# Patient Record
Sex: Female | Born: 1950 | Race: White | Hispanic: No | Marital: Married | Smoking: Current every day smoker
Health system: Southern US, Community
[De-identification: ages and names within clinical notes are randomized; demographics above are authoritative.]

## PROBLEM LIST (undated history)

## (undated) DIAGNOSIS — M8580 Other specified disorders of bone density and structure, unspecified site: Secondary | ICD-10-CM

## (undated) DIAGNOSIS — F1090 Alcohol use, unspecified, uncomplicated: Secondary | ICD-10-CM

## (undated) DIAGNOSIS — C4490 Unspecified malignant neoplasm of skin, unspecified: Secondary | ICD-10-CM

## (undated) DIAGNOSIS — U071 COVID-19: Secondary | ICD-10-CM

## (undated) DIAGNOSIS — H269 Unspecified cataract: Secondary | ICD-10-CM

## (undated) DIAGNOSIS — M1990 Unspecified osteoarthritis, unspecified site: Secondary | ICD-10-CM

## (undated) DIAGNOSIS — C50919 Malignant neoplasm of unspecified site of unspecified female breast: Secondary | ICD-10-CM

## (undated) DIAGNOSIS — F39 Unspecified mood [affective] disorder: Secondary | ICD-10-CM

## (undated) DIAGNOSIS — F32A Depression, unspecified: Secondary | ICD-10-CM

## (undated) DIAGNOSIS — F419 Anxiety disorder, unspecified: Secondary | ICD-10-CM

## (undated) DIAGNOSIS — F329 Major depressive disorder, single episode, unspecified: Secondary | ICD-10-CM

## (undated) DIAGNOSIS — I10 Essential (primary) hypertension: Secondary | ICD-10-CM

## (undated) DIAGNOSIS — J449 Chronic obstructive pulmonary disease, unspecified: Secondary | ICD-10-CM

## (undated) DIAGNOSIS — Z789 Other specified health status: Secondary | ICD-10-CM

## (undated) DIAGNOSIS — J439 Emphysema, unspecified: Secondary | ICD-10-CM

## (undated) DIAGNOSIS — D126 Benign neoplasm of colon, unspecified: Secondary | ICD-10-CM

## (undated) DIAGNOSIS — T7840XA Allergy, unspecified, initial encounter: Secondary | ICD-10-CM

## (undated) DIAGNOSIS — F17200 Nicotine dependence, unspecified, uncomplicated: Secondary | ICD-10-CM

## (undated) DIAGNOSIS — C801 Malignant (primary) neoplasm, unspecified: Secondary | ICD-10-CM

## (undated) DIAGNOSIS — D696 Thrombocytopenia, unspecified: Secondary | ICD-10-CM

## (undated) HISTORY — DX: Malignant (primary) neoplasm, unspecified: C80.1

## (undated) HISTORY — DX: Other specified disorders of bone density and structure, unspecified site: M85.80

## (undated) HISTORY — DX: Unspecified mood (affective) disorder: F39

## (undated) HISTORY — PX: INDUCED ABORTION: SHX677

## (undated) HISTORY — DX: Essential (primary) hypertension: I10

## (undated) HISTORY — DX: Unspecified osteoarthritis, unspecified site: M19.90

## (undated) HISTORY — PX: TONSILLECTOMY: SUR1361

## (undated) HISTORY — DX: Other specified health status: Z78.9

## (undated) HISTORY — PX: FRACTURE SURGERY: SHX138

## (undated) HISTORY — DX: Unspecified cataract: H26.9

## (undated) HISTORY — DX: Nicotine dependence, unspecified, uncomplicated: F17.200

## (undated) HISTORY — DX: Anxiety disorder, unspecified: F41.9

## (undated) HISTORY — DX: Depression, unspecified: F32.A

## (undated) HISTORY — DX: Benign neoplasm of colon, unspecified: D12.6

## (undated) HISTORY — DX: Unspecified malignant neoplasm of skin, unspecified: C44.90

## (undated) HISTORY — DX: Major depressive disorder, single episode, unspecified: F32.9

## (undated) HISTORY — DX: Allergy, unspecified, initial encounter: T78.40XA

## (undated) HISTORY — DX: Emphysema, unspecified: J43.9

## (undated) HISTORY — DX: Malignant neoplasm of unspecified site of unspecified female breast: C50.919

## (undated) HISTORY — DX: Alcohol use, unspecified, uncomplicated: F10.90

---

## 1982-11-27 HISTORY — PX: FINGER FRACTURE SURGERY: SHX638

## 2005-09-04 ENCOUNTER — Ambulatory Visit: Payer: Self-pay | Admitting: Unknown Physician Specialty

## 2006-09-06 ENCOUNTER — Ambulatory Visit: Payer: Self-pay | Admitting: Unknown Physician Specialty

## 2007-02-18 ENCOUNTER — Ambulatory Visit: Payer: Self-pay | Admitting: Unknown Physician Specialty

## 2007-10-23 ENCOUNTER — Ambulatory Visit: Payer: Self-pay | Admitting: Unknown Physician Specialty

## 2007-11-26 ENCOUNTER — Ambulatory Visit: Payer: Self-pay | Admitting: Unknown Physician Specialty

## 2008-12-18 ENCOUNTER — Ambulatory Visit: Payer: Self-pay | Admitting: Unknown Physician Specialty

## 2010-01-28 ENCOUNTER — Ambulatory Visit: Payer: Self-pay | Admitting: Unknown Physician Specialty

## 2011-06-21 ENCOUNTER — Ambulatory Visit: Payer: Self-pay | Admitting: Unknown Physician Specialty

## 2012-06-04 ENCOUNTER — Ambulatory Visit: Payer: Self-pay

## 2012-06-24 ENCOUNTER — Ambulatory Visit: Payer: Self-pay

## 2012-06-25 ENCOUNTER — Ambulatory Visit: Payer: Self-pay

## 2012-07-15 ENCOUNTER — Ambulatory Visit: Payer: Self-pay | Admitting: Surgery

## 2012-09-23 ENCOUNTER — Ambulatory Visit: Payer: Self-pay | Admitting: Unknown Physician Specialty

## 2012-09-23 HISTORY — PX: COLONOSCOPY: SHX174

## 2012-11-27 HISTORY — PX: BREAST BIOPSY: SHX20

## 2016-06-28 ENCOUNTER — Inpatient Hospital Stay
Admission: RE | Admit: 2016-06-28 | Discharge: 2016-06-28 | Disposition: A | Discharge status: Home / Self Care | Payer: Self-pay | Source: Ambulatory Visit | Attending: *Deleted | Admitting: *Deleted

## 2016-06-28 ENCOUNTER — Other Ambulatory Visit: Payer: Self-pay | Admitting: *Deleted

## 2016-06-28 DIAGNOSIS — Z9289 Personal history of other medical treatment: Secondary | ICD-10-CM

## 2016-08-21 ENCOUNTER — Other Ambulatory Visit: Payer: Self-pay | Admitting: Certified Nurse Midwife

## 2016-08-21 DIAGNOSIS — Z1231 Encounter for screening mammogram for malignant neoplasm of breast: Secondary | ICD-10-CM

## 2016-09-15 ENCOUNTER — Encounter: Payer: Self-pay | Admitting: Radiology

## 2016-09-15 ENCOUNTER — Ambulatory Visit
Admission: RE | Admit: 2016-09-15 | Discharge: 2016-09-15 | Disposition: A | Discharge status: Home / Self Care | Payer: Medicare Other | Source: Ambulatory Visit | Attending: Certified Nurse Midwife | Admitting: Certified Nurse Midwife

## 2016-09-15 DIAGNOSIS — Z1231 Encounter for screening mammogram for malignant neoplasm of breast: Secondary | ICD-10-CM | POA: Insufficient documentation

## 2017-07-31 ENCOUNTER — Other Ambulatory Visit: Payer: Self-pay | Admitting: Certified Nurse Midwife

## 2017-08-15 ENCOUNTER — Other Ambulatory Visit: Payer: Self-pay | Admitting: Certified Nurse Midwife

## 2017-08-15 DIAGNOSIS — Z1231 Encounter for screening mammogram for malignant neoplasm of breast: Secondary | ICD-10-CM

## 2017-08-28 ENCOUNTER — Encounter: Payer: Self-pay | Admitting: Certified Nurse Midwife

## 2017-08-28 ENCOUNTER — Ambulatory Visit (INDEPENDENT_AMBULATORY_CARE_PROVIDER_SITE_OTHER): Payer: Medicare Other | Admitting: Certified Nurse Midwife

## 2017-08-28 VITALS — BP 110/70 | HR 76 | Ht 65.0 in | Wt 121.0 lb

## 2017-08-28 DIAGNOSIS — Z1239 Encounter for other screening for malignant neoplasm of breast: Secondary | ICD-10-CM

## 2017-08-28 DIAGNOSIS — Z01419 Encounter for gynecological examination (general) (routine) without abnormal findings: Secondary | ICD-10-CM

## 2017-08-28 DIAGNOSIS — Z1231 Encounter for screening mammogram for malignant neoplasm of breast: Secondary | ICD-10-CM | POA: Diagnosis not present

## 2017-08-28 DIAGNOSIS — Z789 Other specified health status: Secondary | ICD-10-CM | POA: Diagnosis not present

## 2017-08-28 DIAGNOSIS — Z13820 Encounter for screening for osteoporosis: Secondary | ICD-10-CM

## 2017-08-28 DIAGNOSIS — F1090 Alcohol use, unspecified, uncomplicated: Secondary | ICD-10-CM
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
## 2017-09-01 ENCOUNTER — Encounter: Payer: Self-pay | Admitting: Certified Nurse Midwife

## 2017-09-01 DIAGNOSIS — M8580 Other specified disorders of bone density and structure, unspecified site: Secondary | ICD-10-CM | POA: Insufficient documentation

## 2017-09-01 DIAGNOSIS — Z789 Other specified health status: Secondary | ICD-10-CM | POA: Insufficient documentation

## 2017-09-01 DIAGNOSIS — F329 Major depressive disorder, single episode, unspecified: Secondary | ICD-10-CM | POA: Insufficient documentation

## 2017-09-03 ENCOUNTER — Telehealth: Payer: Self-pay | Admitting: Certified Nurse Midwife

## 2017-09-03 NOTE — Telephone Encounter (Signed)
-----   Message from Dalia Heading, [place] sent at 09/02/2017  2:55 PM EDT ----- Regarding: schedule screening Dexa at [facility] schedule screening dexa. She is scheduled for a mammogram 10/18. Can we do it the same day?

## 2017-09-03 NOTE — Telephone Encounter (Signed)
Lmtrc

## 2017-09-04 NOTE — Telephone Encounter (Signed)
Patient is aware of appointment °

## 2017-09-19 ENCOUNTER — Ambulatory Visit
Admission: RE | Admit: 2017-09-19 | Discharge: 2017-09-19 | Disposition: A | Discharge status: Home / Self Care | Payer: Medicare Other | Source: Ambulatory Visit | Attending: Certified Nurse Midwife | Admitting: Certified Nurse Midwife

## 2017-09-19 DIAGNOSIS — Z1231 Encounter for screening mammogram for malignant neoplasm of breast: Secondary | ICD-10-CM | POA: Diagnosis not present

## 2017-10-03 ENCOUNTER — Ambulatory Visit
Admission: RE | Admit: 2017-10-03 | Discharge: 2017-10-03 | Disposition: A | Discharge status: Home / Self Care | Payer: Medicare Other | Source: Ambulatory Visit | Attending: Certified Nurse Midwife | Admitting: Certified Nurse Midwife

## 2017-10-03 DIAGNOSIS — Z13820 Encounter for screening for osteoporosis: Secondary | ICD-10-CM

## 2017-10-03 DIAGNOSIS — M8588 Other specified disorders of bone density and structure, other site: Secondary | ICD-10-CM | POA: Diagnosis not present

## 2017-10-28 ENCOUNTER — Other Ambulatory Visit: Payer: Self-pay | Admitting: Certified Nurse Midwife

## 2017-10-29 ENCOUNTER — Encounter: Payer: Self-pay | Admitting: Certified Nurse Midwife

## 2017-10-29 ENCOUNTER — Telehealth: Payer: Self-pay | Admitting: Certified Nurse Midwife

## 2017-10-29 NOTE — Telephone Encounter (Signed)
10/29/2017 Spoke with patient regarding recent DEXA scan. There is osteopenia of both the spine (T score-2.0) and femur (T score -2.2, was -1.4previously). Frax scores with drinking less than 3 units/day was 12/3.9% and with 3 or greater alcohol drinks is 15/5.9%. Advised that treatment is recommended. Discussed treatments like biphosphonates (Actonel, Fosamax, Boniva), how they are taken and possible side effects. Advised that there are other treatments, but would refer to endocrinologist if interested in other injectable medications.  Discussed risk factors of smoking and daily drinking alcohol and encouraged to decrease both of these. Patient does get adequate calcium in her diet: milk 1-2 cups daily, cheese, OJ with calcium. Used to take dog for walks, but not walking anymore. Not exercising.  Patient decided she did not want any other treatment. Will continue to get the calcium in her diet. Encouraged her to take a vitamin D3 supplement, exercise, and decrease smoking and drinking. Given website for NOF.org for ideas on other exercises.   FU next year for annual.

## 2017-11-27 DIAGNOSIS — D126 Benign neoplasm of colon, unspecified: Secondary | ICD-10-CM

## 2017-11-27 HISTORY — DX: Benign neoplasm of colon, unspecified: D12.6

## 2018-04-23 DIAGNOSIS — F1090 Alcohol use, unspecified, uncomplicated: Secondary | ICD-10-CM | POA: Insufficient documentation

## 2018-04-23 DIAGNOSIS — F32A Depression, unspecified: Secondary | ICD-10-CM | POA: Insufficient documentation

## 2018-04-23 DIAGNOSIS — Z789 Other specified health status: Secondary | ICD-10-CM | POA: Insufficient documentation

## 2018-08-09 ENCOUNTER — Encounter: Admission: RE | Payer: Self-pay | Source: Ambulatory Visit

## 2018-08-09 ENCOUNTER — Encounter
Admission: RE | Disposition: A | Discharge status: Home / Self Care | Payer: Self-pay | Source: Ambulatory Visit | Attending: Unknown Physician Specialty

## 2018-08-09 ENCOUNTER — Ambulatory Visit: Payer: Medicare Other | Admitting: Anesthesiology

## 2018-08-09 ENCOUNTER — Encounter: Payer: Self-pay | Admitting: *Deleted

## 2018-08-09 ENCOUNTER — Ambulatory Visit
Admission: RE | Admit: 2018-08-09 | Discharge: 2018-08-09 | Disposition: A | Discharge status: Home / Self Care | Payer: Medicare Other | Source: Ambulatory Visit | Attending: Unknown Physician Specialty | Admitting: Unknown Physician Specialty

## 2018-08-09 ENCOUNTER — Ambulatory Visit
Admission: RE | Admit: 2018-08-09 | Payer: Medicare Other | Source: Ambulatory Visit | Admitting: Unknown Physician Specialty

## 2018-08-09 ENCOUNTER — Other Ambulatory Visit: Payer: Self-pay

## 2018-08-09 DIAGNOSIS — Z1211 Encounter for screening for malignant neoplasm of colon: Secondary | ICD-10-CM | POA: Insufficient documentation

## 2018-08-09 DIAGNOSIS — F17200 Nicotine dependence, unspecified, uncomplicated: Secondary | ICD-10-CM | POA: Insufficient documentation

## 2018-08-09 DIAGNOSIS — Z8249 Family history of ischemic heart disease and other diseases of the circulatory system: Secondary | ICD-10-CM | POA: Insufficient documentation

## 2018-08-09 DIAGNOSIS — F329 Major depressive disorder, single episode, unspecified: Secondary | ICD-10-CM | POA: Insufficient documentation

## 2018-08-09 DIAGNOSIS — Z880 Allergy status to penicillin: Secondary | ICD-10-CM | POA: Diagnosis not present

## 2018-08-09 DIAGNOSIS — Z885 Allergy status to narcotic agent status: Secondary | ICD-10-CM | POA: Insufficient documentation

## 2018-08-09 DIAGNOSIS — Z801 Family history of malignant neoplasm of trachea, bronchus and lung: Secondary | ICD-10-CM | POA: Insufficient documentation

## 2018-08-09 DIAGNOSIS — M8580 Other specified disorders of bone density and structure, unspecified site: Secondary | ICD-10-CM | POA: Diagnosis not present

## 2018-08-09 DIAGNOSIS — Z800 Family history of malignant neoplasm of digestive organs: Secondary | ICD-10-CM | POA: Diagnosis not present

## 2018-08-09 DIAGNOSIS — Z79899 Other long term (current) drug therapy: Secondary | ICD-10-CM | POA: Diagnosis not present

## 2018-08-09 DIAGNOSIS — Z8261 Family history of arthritis: Secondary | ICD-10-CM | POA: Diagnosis not present

## 2018-08-09 DIAGNOSIS — Z86010 Personal history of colonic polyps: Secondary | ICD-10-CM | POA: Diagnosis not present

## 2018-08-09 DIAGNOSIS — D123 Benign neoplasm of transverse colon: Secondary | ICD-10-CM | POA: Insufficient documentation

## 2018-08-09 DIAGNOSIS — D125 Benign neoplasm of sigmoid colon: Secondary | ICD-10-CM | POA: Insufficient documentation

## 2018-08-09 DIAGNOSIS — K640 First degree hemorrhoids: Secondary | ICD-10-CM | POA: Insufficient documentation

## 2018-08-09 HISTORY — PX: COLONOSCOPY WITH PROPOFOL: SHX5780

## 2018-08-09 SURGERY — COLONOSCOPY WITH PROPOFOL
Anesthesia: General

## 2018-08-09 MED ORDER — LIDOCAINE HCL (CARDIAC) PF 100 MG/5ML IV SOSY
PREFILLED_SYRINGE | INTRAVENOUS | Status: DC | PRN
  Administered 2018-08-09: 25 mg via INTRATRACHEAL

## 2018-08-09 MED ORDER — SODIUM CHLORIDE 0.9 % IV SOLN: INTRAVENOUS | Status: DC

## 2018-08-09 MED ORDER — SODIUM CHLORIDE 0.9 % IV SOLN
INTRAVENOUS | Status: DC
  Administered 2018-08-09: 10:00:00 via INTRAVENOUS

## 2018-08-09 MED ORDER — PROPOFOL 500 MG/50ML IV EMUL
INTRAVENOUS | Status: DC | PRN
  Administered 2018-08-09: 80 ug/kg/min via INTRAVENOUS

## 2018-08-09 MED ORDER — PROPOFOL 10 MG/ML IV BOLUS
INTRAVENOUS | Status: DC | PRN
  Administered 2018-08-09: 100 mg via INTRAVENOUS

## 2018-08-09 NOTE — Anesthesia Preprocedure Evaluation (Signed)
Anesthesia Evaluation  Patient identified by MRN, date of birth, ID band Patient awake    Reviewed: Allergy & Precautions, NPO status , Patient's Chart, lab work & pertinent test results  History of Anesthesia Complications Negative for: history of anesthetic complications  Airway Mallampati: II       Dental  (+) Partial Lower, Partial Upper   Pulmonary neg sleep apnea, neg COPD, Current Smoker,           Cardiovascular (-) hypertension(-) Past MI and (-) CHF (-) dysrhythmias (-) Valvular Problems/Murmurs     Neuro/Psych neg Seizures Depression    GI/Hepatic Neg liver ROS, neg GERD  ,  Endo/Other  neg diabetes  Renal/GU negative Renal ROS     Musculoskeletal   Abdominal   Peds  Hematology   Anesthesia Other Findings   Reproductive/Obstetrics                             Anesthesia Physical Anesthesia Plan  ASA: II  Anesthesia Plan: General   Post-op Pain Management:    Induction: Intravenous  PONV Risk Score and Plan: 2 and Propofol infusion and TIVA  Airway Management Planned: Nasal Cannula  Additional Equipment:   Intra-op Plan:   Post-operative Plan:   Informed Consent: I have reviewed the patients History and Physical, chart, labs and discussed the procedure including the risks, benefits and alternatives for the proposed anesthesia with the patient or authorized representative who has indicated his/her understanding and acceptance.     Plan Discussed with:   Anesthesia Plan Comments:         Anesthesia Quick Evaluation

## 2018-08-09 NOTE — Anesthesia Post-op Follow-up Note (Signed)
Anesthesia QCDR form completed.        

## 2018-08-09 NOTE — Anesthesia Postprocedure Evaluation (Signed)
Anesthesia Post Note  Patient: Sandra Franco  Procedure(s) Performed: COLONOSCOPY WITH PROPOFOL (N/A )  Patient location during evaluation: Endoscopy Anesthesia Type: General Level of consciousness: awake and alert Pain management: pain level controlled Vital Signs Assessment: post-procedure vital signs reviewed and stable Respiratory status: spontaneous breathing and respiratory function stable Cardiovascular status: stable Anesthetic complications: no     Last Vitals:  Vitals:   08/09/18 1038 08/09/18 1048  BP: 117/85 132/83  Pulse: 76 70  Resp: 20 14  Temp:    SpO2: 100% 100%    Last Pain:  Vitals:   08/09/18 1048  TempSrc:   PainSc: 0-No pain                 KEPHART,WILLIAM K

## 2018-08-09 NOTE — Transfer of Care (Signed)
Immediate Anesthesia Transfer of Care Note  Patient: Sandra Franco  Procedure(s) Performed: COLONOSCOPY WITH PROPOFOL (N/A )  Patient Location: PACU and Endoscopy Unit  Anesthesia Type:General  Level of Consciousness: awake  Airway & Oxygen Therapy: Patient Spontanous Breathing and Patient connected to nasal cannula oxygen  Post-op Assessment: Report given to RN and Post -op Vital signs reviewed and stable  Post vital signs: Reviewed and stable  Last Vitals:  Vitals Value Taken Time  BP    Temp    Pulse 80 08/09/2018 10:21 AM  Resp 18 08/09/2018 10:21 AM  SpO2 100 % 08/09/2018 10:21 AM  Vitals shown include unvalidated device data.  Last Pain:  Vitals:   08/09/18 0916  TempSrc: Tympanic  PainSc: 0-No pain         Complications: No apparent anesthesia complications

## 2018-08-09 NOTE — H&P (Signed)
Primary Care Physician:  Dalia Heading, CNM Primary Gastroenterologist:  Dr. Vira Agar  Pre-Procedure History & Physical: HPI:  Sandra Franco is a 67 y.o. female is here for an colonoscopy.  Done for [facility] of colon polyps.   Past Medical History:  Diagnosis Date  . Depression   . Heavy alcohol consumption   . Osteopenia     Past Surgical History:  Procedure Laterality Date  . BREAST BIOPSY Right 2014   NEG  . COLONOSCOPY  09/23/2012   negative  . FINGER FRACTURE SURGERY Right 1984  . TONSILLECTOMY      Prior to Admission medications   Medication Sig Start Date End Date Taking? Authorizing Provider  buPROPion (WELLBUTRIN XL) 300 MG 24 hr tablet take 1 tablet by mouth once daily 10/29/17  Yes Dalia Heading, CNM  Calcium Citrate-Vitamin D (CALCIUM + D PO) Take 1 capsule by mouth daily.   Yes [provider]  Multiple Vitamin (MULTIVITAMIN) capsule Take 1 capsule by mouth daily.   Yes [provider]  FLUZONE HIGH-DOSE 0.5 ML injection inject 0.5 milliliter intramuscularly 08/24/17   [provider]    Allergies as of 08/08/2018 - Review Complete 08/08/2018  Allergen Reaction Noted  . Amoxicillin Rash 08/28/2017  . Codeine Nausea Only 08/28/2017    Family History  Problem Relation Age of Onset  . Rheum arthritis Mother   . Hypertension Mother   . Lung cancer Father 47  . Pancreatic cancer Maternal Aunt 75  . Breast cancer Neg Hx     Social History   Socioeconomic History  . Marital status: Married    Spouse name: Novella Rob  . Number of children: 1  . Years of education: Not on file  . Highest education level: Not on file  Occupational History  . Occupation: retired Tour manager  . Financial resource strain: Not on file  . Food insecurity:    Worry: Not on file    Inability: Not on file  . Transportation needs:    Medical: Not on file    Non-medical: Not on file  Tobacco Use  . Smoking status: Current Every Day Smoker     Packs/day: 0.50    Types: Cigarettes  . Smokeless tobacco: Never Used  Substance and Sexual Activity  . Alcohol use: Yes    Alcohol/week: 14.0 standard drinks    Types: 14 Cans of beer per week    Comment: 1-4 cans/day  . Drug use: No  . Sexual activity: Not Currently    Birth control/protection: Post-menopausal  Lifestyle  . Physical activity:    Days per week: Not on file    Minutes per session: Not on file  . Stress: Not on file  Relationships  . Social connections:    Talks on phone: Not on file    Gets together: Not on file    Attends religious service: Not on file    Active member of club or organization: Not on file    Attends meetings of clubs or organizations: Not on file    Relationship status: Not on file  . Intimate partner violence:    Fear of current or ex partner: Not on file    Emotionally abused: Not on file    Physically abused: Not on file    Forced sexual activity: Not on file  Other Topics Concern  . Not on file  Social History Narrative  . Not on file    Review of Systems: See HPI, otherwise negative  ROS  Physical Exam: BP (!) 122/95   Pulse 82   Temp (!) 97.5 F (36.4 C) (Tympanic)   Resp 16   Ht 5\' 5"  (1.651 m)   Wt 56.7 kg   SpO2 99%   BMI 20.80 kg/m  General:   Alert,  pleasant and cooperative in NAD Head:  Normocephalic and atraumatic. Neck:  Supple; no masses or thyromegaly. Lungs:  Clear throughout to auscultation.    Heart:  Regular rate and rhythm. Abdomen:  Soft, nontender and nondistended. Normal bowel sounds, without guarding, and without rebound.   Neurologic:  Alert and  oriented x4;  grossly normal neurologically.  Impression/Plan: Sandra Franco is here for an colonoscopy to be performed for [facility] colon polyps.  Risks, benefits, limitations, and alternatives regarding  colonoscopy have been reviewed with the patient.  Questions have been answered.  All parties agreeable.   Gaylyn Cheers, MD  08/09/2018, 9:48 AM

## 2018-08-09 NOTE — Op Note (Addendum)
[facility] Patient Name: Sandra Franco Procedure Date: 08/09/2018 9:42 AM MRN: [id number] Account #: [id number] Date of Birth: Jan 29, 1951 Admit Type: Outpatient Age: 67 Room: [facility] ENDO ROOM 3 Gender: Female Note Status: Finalized Procedure:            Colonoscopy Indications:          High risk colon cancer surveillance: Personal history                        of colonic polyps Providers:            Manya Silvas, MD Referring MD:         Jesus Genera. Danise Mina (Referring MD) Medicines:            Propofol per Anesthesia Complications:        No immediate complications. Procedure:            Pre-Anesthesia Assessment:                       - After reviewing the risks and benefits, the patient                        was deemed in satisfactory condition to undergo the                        procedure.                       After obtaining informed consent, the colonoscope was                        passed under direct vision. Throughout the procedure,                        the patient's blood pressure, pulse, and oxygen                        saturations were monitored continuously. The                        Colonoscope was introduced through the anus and                        advanced to the the cecum, identified by appendiceal                        orifice and ileocecal valve. The colonoscopy was                        performed without difficulty. The patient tolerated the                        procedure well. The quality of the bowel preparation                        was excellent. Findings:      A small polyp was found in the transverse colon. The polyp was sessile.       The polyp was removed with a hot snare. Resection and retrieval were       complete.      A diminutive polyp was found in  the transverse colon. The polyp was       sessile. The polyp was removed with a hot snare. Resection and retrieval       were complete.      A  diminutive polyp was found in the sigmoid colon. The polyp was       sessile. The polyp was removed with a hot snare. Resection and retrieval       were complete.      Internal hemorrhoids were found during endoscopy. The hemorrhoids were       small and Grade I (internal hemorrhoids that do not prolapse).      The exam was otherwise without abnormality. Impression:           - One small polyp in the transverse colon, removed with                        a hot snare. Resected and retrieved.                       - One diminutive polyp in the transverse colon, removed                        with a hot snare. Resected and retrieved.                       - One diminutive polyp in the sigmoid colon, removed                        with a hot snare. Resected and retrieved.                       - Internal hemorrhoids.                       - The examination was otherwise normal. Recommendation:       - Await pathology results. Manya Silvas, MD 08/09/2018 10:18:23 AM This report has been signed electronically. Number of Addenda: 0 Note Initiated On: 08/09/2018 9:42 AM Scope Withdrawal Time: 0 hours 10 minutes 48 seconds  Total Procedure Duration: 0 hours 20 minutes 49 seconds       [facility]

## 2018-08-12 ENCOUNTER — Encounter: Payer: Self-pay | Admitting: Unknown Physician Specialty

## 2018-08-12 LAB — SURGICAL PATHOLOGY

## 2018-09-06 ENCOUNTER — Other Ambulatory Visit: Payer: Self-pay | Admitting: Certified Nurse Midwife

## 2018-09-06 DIAGNOSIS — Z1231 Encounter for screening mammogram for malignant neoplasm of breast: Secondary | ICD-10-CM

## 2018-09-26 ENCOUNTER — Ambulatory Visit
Admission: RE | Admit: 2018-09-26 | Discharge: 2018-09-26 | Disposition: A | Discharge status: Home / Self Care | Payer: Medicare Other | Source: Ambulatory Visit | Attending: Certified Nurse Midwife | Admitting: Certified Nurse Midwife

## 2018-09-26 DIAGNOSIS — Z1231 Encounter for screening mammogram for malignant neoplasm of breast: Secondary | ICD-10-CM | POA: Diagnosis not present

## 2018-10-02 ENCOUNTER — Encounter: Payer: Self-pay | Admitting: Certified Nurse Midwife

## 2018-10-02 ENCOUNTER — Ambulatory Visit (INDEPENDENT_AMBULATORY_CARE_PROVIDER_SITE_OTHER): Payer: Medicare Other | Admitting: Certified Nurse Midwife

## 2018-10-02 VITALS — BP 120/70 | HR 84 | Ht 65.0 in | Wt 120.5 lb

## 2018-10-02 DIAGNOSIS — F17200 Nicotine dependence, unspecified, uncomplicated: Secondary | ICD-10-CM

## 2018-10-02 DIAGNOSIS — Z789 Other specified health status: Secondary | ICD-10-CM

## 2018-10-02 DIAGNOSIS — Z862 Personal history of diseases of the blood and blood-forming organs and certain disorders involving the immune mechanism: Secondary | ICD-10-CM

## 2018-10-02 DIAGNOSIS — Z01419 Encounter for gynecological examination (general) (routine) without abnormal findings: Secondary | ICD-10-CM | POA: Diagnosis not present

## 2018-10-02 DIAGNOSIS — Z13220 Encounter for screening for lipoid disorders: Secondary | ICD-10-CM

## 2018-10-02 DIAGNOSIS — F1090 Alcohol use, unspecified, uncomplicated: Secondary | ICD-10-CM

## 2018-10-02 DIAGNOSIS — M85859 Other specified disorders of bone density and structure, unspecified thigh: Secondary | ICD-10-CM

## 2018-10-02 MED ORDER — BUPROPION HCL ER (XL) 300 MG PO TB24: 300.0000 mg | ORAL_TABLET | Freq: Every day | ORAL | 3 refills | Status: DC
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
## 2018-10-03 LAB — COMPREHENSIVE METABOLIC PANEL
A/G RATIO: 2.3 — AB (ref 1.2–2.2)
ALK PHOS: 81 IU/L (ref 39–117)
ALT: 16 IU/L (ref 0–32)
AST: 22 IU/L (ref 0–40)
Albumin: 5 g/dL — ABNORMAL HIGH (ref 3.6–4.8)
BUN/Creatinine Ratio: 10 — ABNORMAL LOW (ref 12–28)
BUN: 8 mg/dL (ref 8–27)
Bilirubin Total: 0.5 mg/dL (ref 0.0–1.2)
CHLORIDE: 98 mmol/L (ref 96–106)
CO2: 26 mmol/L (ref 20–29)
Calcium: 10.8 mg/dL — ABNORMAL HIGH (ref 8.7–10.3)
Creatinine, Ser: 0.79 mg/dL (ref 0.57–1.00)
GFR calc Af Amer: 88 mL/min/{1.73_m2} (ref >59)
GFR calc non Af Amer: 77 mL/min/{1.73_m2} (ref >59)
GLOBULIN, TOTAL: 2.2 g/dL (ref 1.5–4.5)
Glucose: 74 mg/dL (ref 65–99)
POTASSIUM: 5.1 mmol/L (ref 3.5–5.2)
SODIUM: 141 mmol/L (ref 134–144)
Total Protein: 7.2 g/dL (ref 6.0–8.5)

## 2018-10-03 LAB — CBC WITH DIFFERENTIAL/PLATELET
BASOS: 1 %
Basophils Absolute: 0.1 10*3/uL (ref 0.0–0.2)
EOS (ABSOLUTE): 0.5 10*3/uL — AB (ref 0.0–0.4)
Eos: 7 %
HEMATOCRIT: 44.8 % (ref 34.0–46.6)
Hemoglobin: 15.1 g/dL (ref 11.1–15.9)
IMMATURE GRANULOCYTES: 0 %
Immature Grans (Abs): 0 10*3/uL (ref 0.0–0.1)
Lymphocytes Absolute: 2.3 10*3/uL (ref 0.7–3.1)
Lymphs: 38 %
MCH: 30.4 pg (ref 26.6–33.0)
MCHC: 33.7 g/dL (ref 31.5–35.7)
MCV: 90 fL (ref 79–97)
MONOS ABS: 0.5 10*3/uL (ref 0.1–0.9)
Monocytes: 7 %
NEUTROS PCT: 47 %
Neutrophils Absolute: 3.2 10*3/uL (ref 1.4–7.0)
Platelets: 114 10*3/uL — ABNORMAL LOW (ref 150–450)
RBC: 4.97 x10E6/uL (ref 3.77–5.28)
RDW: 13 % (ref 12.3–15.4)
WBC: 6.5 10*3/uL (ref 3.4–10.8)

## 2018-10-03 LAB — LIPID PANEL
CHOL/HDL RATIO: 2.8 ratio (ref 0.0–4.4)
CHOLESTEROL TOTAL: 220 mg/dL — AB (ref 100–199)
HDL: 80 mg/dL (ref >39)
LDL CALC: 117 mg/dL — AB (ref 0–99)
Triglycerides: 117 mg/dL (ref 0–149)
VLDL Cholesterol Cal: 23 mg/dL (ref 5–40)

## 2018-10-04 ENCOUNTER — Encounter: Payer: Self-pay | Admitting: Certified Nurse Midwife

## 2018-10-04 DIAGNOSIS — F17200 Nicotine dependence, unspecified, uncomplicated: Secondary | ICD-10-CM | POA: Insufficient documentation

## 2019-01-17 ENCOUNTER — Other Ambulatory Visit: Payer: Self-pay | Admitting: Certified Nurse Midwife

## 2019-01-17 ENCOUNTER — Encounter: Payer: Self-pay | Admitting: Certified Nurse Midwife

## 2019-01-20 ENCOUNTER — Encounter: Payer: Self-pay | Admitting: Certified Nurse Midwife

## 2019-01-20 DIAGNOSIS — D696 Thrombocytopenia, unspecified: Secondary | ICD-10-CM | POA: Insufficient documentation

## 2019-01-25 ENCOUNTER — Other Ambulatory Visit
Admission: RE | Admit: 2019-01-25 | Discharge: 2019-01-25 | Disposition: A | Discharge status: Home / Self Care | Payer: Medicare Other | Source: Ambulatory Visit | Attending: Certified Nurse Midwife | Admitting: Certified Nurse Midwife

## 2019-01-25 DIAGNOSIS — D696 Thrombocytopenia, unspecified: Secondary | ICD-10-CM | POA: Insufficient documentation

## 2019-01-25 LAB — CBC
HEMATOCRIT: 42.3 % (ref 36.0–46.0)
HEMOGLOBIN: 14 g/dL (ref 12.0–15.0)
MCH: 30.2 pg (ref 26.0–34.0)
MCHC: 33.1 g/dL (ref 30.0–36.0)
MCV: 91.2 fL (ref 80.0–100.0)
NRBC: 0 % (ref 0.0–0.2)
Platelets: 240 10*3/uL (ref 150–400)
RBC: 4.64 MIL/uL (ref 3.87–5.11)
RDW: 13.2 % (ref 11.5–15.5)
WBC: 6 10*3/uL (ref 4.0–10.5)

## 2019-01-25 LAB — TECHNOLOGIST SMEAR REVIEW: TECH REVIEW: NORMAL

## 2019-10-08 ENCOUNTER — Other Ambulatory Visit: Payer: Self-pay | Admitting: Certified Nurse Midwife
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
## 2019-11-17 ENCOUNTER — Ambulatory Visit (INDEPENDENT_AMBULATORY_CARE_PROVIDER_SITE_OTHER): Payer: Medicare Other | Admitting: Certified Nurse Midwife

## 2019-11-17 ENCOUNTER — Other Ambulatory Visit: Payer: Self-pay

## 2019-11-17 ENCOUNTER — Other Ambulatory Visit (HOSPITAL_COMMUNITY)
Admission: RE | Admit: 2019-11-17 | Discharge: 2019-11-17 | Disposition: A | Discharge status: Home / Self Care | Payer: Medicare Other | Source: Ambulatory Visit | Attending: Certified Nurse Midwife | Admitting: Certified Nurse Midwife

## 2019-11-17 ENCOUNTER — Encounter: Payer: Self-pay | Admitting: Certified Nurse Midwife

## 2019-11-17 VITALS — BP 130/80 | HR 77 | Temp 97.3°F | Ht 65.0 in | Wt 116.0 lb

## 2019-11-17 DIAGNOSIS — F329 Major depressive disorder, single episode, unspecified: Secondary | ICD-10-CM

## 2019-11-17 DIAGNOSIS — Z124 Encounter for screening for malignant neoplasm of cervix: Secondary | ICD-10-CM | POA: Insufficient documentation

## 2019-11-17 DIAGNOSIS — F17200 Nicotine dependence, unspecified, uncomplicated: Secondary | ICD-10-CM

## 2019-11-17 DIAGNOSIS — M8589 Other specified disorders of bone density and structure, multiple sites: Secondary | ICD-10-CM

## 2019-11-17 DIAGNOSIS — Z01419 Encounter for gynecological examination (general) (routine) without abnormal findings: Secondary | ICD-10-CM | POA: Diagnosis not present

## 2019-11-17 DIAGNOSIS — F32A Depression, unspecified: Secondary | ICD-10-CM

## 2019-11-19 LAB — CYTOLOGY - PAP: Diagnosis: NEGATIVE

## 2020-01-05 ENCOUNTER — Other Ambulatory Visit: Payer: Self-pay | Admitting: Certified Nurse Midwife

## 2020-12-24 ENCOUNTER — Other Ambulatory Visit: Payer: Self-pay

## 2020-12-24 ENCOUNTER — Ambulatory Visit (INDEPENDENT_AMBULATORY_CARE_PROVIDER_SITE_OTHER): Payer: Medicare PPO | Admitting: Adult Health

## 2020-12-24 ENCOUNTER — Encounter: Payer: Self-pay | Admitting: Adult Health

## 2020-12-24 VITALS — BP 145/89 | HR 82 | Temp 97.8°F | Resp 16 | Ht 65.0 in | Wt 112.2 lb

## 2020-12-24 DIAGNOSIS — F17200 Nicotine dependence, unspecified, uncomplicated: Secondary | ICD-10-CM

## 2020-12-24 DIAGNOSIS — Z1389 Encounter for screening for other disorder: Secondary | ICD-10-CM

## 2020-12-24 DIAGNOSIS — Z1231 Encounter for screening mammogram for malignant neoplasm of breast: Secondary | ICD-10-CM | POA: Diagnosis not present

## 2020-12-24 DIAGNOSIS — Z122 Encounter for screening for malignant neoplasm of respiratory organs: Secondary | ICD-10-CM

## 2020-12-24 DIAGNOSIS — F32A Depression, unspecified: Secondary | ICD-10-CM

## 2020-12-24 LAB — POCT URINALYSIS DIPSTICK
Bilirubin, UA: NEGATIVE
Blood, UA: NEGATIVE
Glucose, UA: NEGATIVE
Ketones, UA: NEGATIVE
Leukocytes, UA: NEGATIVE
Nitrite, UA: NEGATIVE
Protein, UA: NEGATIVE
Spec Grav, UA: 1.01 (ref 1.010–1.025)
Urobilinogen, UA: 0.2 E.U./dL
pH, UA: 5 (ref 5.0–8.0)

## 2020-12-24 MED ORDER — BUPROPION HCL ER (XL) 300 MG PO TB24: 300.0000 mg | ORAL_TABLET | Freq: Every day | ORAL | 3 refills | Status: DC

## 2020-12-24 NOTE — Progress Notes (Signed)
New patient visit   Patient: Sandra Franco   DOB: 04/07/1951   70 y.o. Female  MRN: [id number] Visit Date: 12/24/2020  Today's healthcare provider: Jairo BenMichelle Smith Flinchum, FNP   Chief Complaint  Patient presents with  . New Patient (Initial Visit)   Subjective    Sandra SenegalLinda Wahlquist is a 70 y.o. female who presents today as a new patient to establish care.  HPI  Patient presents in office to establish care she states that she has no questions or concerns to address today. Patient reports that she follows a well balanced diet, she is not actively exercising at this time and states that sleep patterns are fairly well.    Smoker 45 years - smokes 1/2 ppd PAP was normal in 11/17/2019   Patient  denies any fever, body aches,chills, rash, chest pain, shortness of breath, nausea, vomiting, or diarrhea.  Denies dizziness, lightheadedness, pre syncopal or syncopal episodes.   Past Medical History:  Diagnosis Date  . Depression   . Heavy alcohol consumption   . Mood disorder (HCC)   . Osteopenia   . Tobacco use disorder   . Tubular adenoma of colon 2019   needs another colonoscopy in 3 years   Past Surgical History:  Procedure Laterality Date  . BREAST BIOPSY Right 2014   NEG  . COLONOSCOPY  09/23/2012   negative  . COLONOSCOPY WITH PROPOFOL N/A 08/09/2018   Procedure: COLONOSCOPY WITH PROPOFOL;  Surgeon: Scot JunElliott, Robert T, MD;  [facility] ENDOSCOPY;  Service: Endoscopy;  Laterality: N/A;  . FINGER FRACTURE SURGERY Right 1984  . INDUCED ABORTION    . TONSILLECTOMY     Family Status  Relation Name Status  . Mother  Deceased  . Father  Deceased  . Mat Aunt  Deceased  . Neg Hx  (Not Specified)   Family History  Problem Relation Age of Onset  . Rheum arthritis Mother   . Hypertension Mother   . Lung cancer Father 1579  . Pancreatic cancer Maternal Aunt 75  . Breast cancer Neg Hx    Social History   Socioeconomic History  . Marital status: Married    Spouse name:  Samara SnideRuffin  . Number of children: 1  . Years of education: 913  . Highest education level: Not on file  Occupational History  . Occupation: retired Geologist, engineeringteacher assistant  Tobacco Use  . Smoking status: Current Every Day Smoker    Packs/day: 0.50    Types: Cigarettes  . Smokeless tobacco: Never Used  Vaping Use  . Vaping Use: Never used  Substance and Sexual Activity  . Alcohol use: Yes    Alcohol/week: 14.0 standard drinks    Types: 14 Cans of beer per week    Comment: 1-4 cans/day  . Drug use: No  . Sexual activity: Not Currently    Birth control/protection: Post-menopausal  Other Topics Concern  . Not on file  Social History Narrative  . Not on file   Social Determinants of Health   Financial Resource Strain: Not on file  Food Insecurity: Not on file  Transportation Needs: Not on file  Physical Activity: Not on file  Stress: Not on file  Social Connections: Not on file   Outpatient Medications Prior to Visit  Medication Sig  . Calcium Citrate-Vitamin D (CALCIUM + D PO) Take 1 capsule by mouth daily.  . Multiple Vitamin (MULTIVITAMIN) capsule Take 1 capsule by mouth daily.  . [DISCONTINUED] buPROPion (WELLBUTRIN XL) 300 MG 24 hr tablet TAKE 1  TABLET(300 MG) BY MOUTH DAILY   No facility-administered medications prior to visit.   Allergies  Allergen Reactions  . Amoxicillin Rash  . Cefdinir Rash  . Codeine Nausea Only    Immunization History  Administered Date(s) Administered  . Fluad Quad(high Dose 65+) 08/21/2019  . Influenza, High Dose Seasonal PF 09/08/2018  . Influenza-Unspecified 08/24/2017, 09/08/2018, 08/21/2019  . Tdap 11/27/2010    Health Maintenance  Topic Date Due  . Hepatitis C Screening  Never done  . COVID-19 Vaccine (1) Never done  . PNA vac Low Risk Adult (1 of 2 - PCV13) Never done  . INFLUENZA VACCINE  06/27/2020  . MAMMOGRAM  09/26/2020  . TETANUS/TDAP  11/27/2020  . COLONOSCOPY (Pts 45-34yrs Insurance coverage will need to be confirmed)   08/09/2028  . DEXA SCAN  Completed    Patient Care Team: Flinchum, Kelby Aline, FNP as PCP - General (Family Medicine)  Review of Systems  Constitutional: Negative.   HENT: Positive for dental problem. Negative for congestion, drooling, ear discharge, ear pain, facial swelling, hearing loss, mouth sores, nosebleeds, postnasal drip, rhinorrhea, sinus pressure, sinus pain, sneezing, sore throat, tinnitus, trouble swallowing and voice change.   Eyes: Negative.   Respiratory: Negative.   Gastrointestinal: Positive for diarrhea. Negative for abdominal distention, abdominal pain, anal bleeding, blood in stool, constipation, nausea and rectal pain.  Genitourinary: Negative.   Musculoskeletal: Negative.   Skin: Negative.   Allergic/Immunologic: Positive for environmental allergies. Negative for food allergies and immunocompromised state.  Neurological: Negative.   Psychiatric/Behavioral: Positive for dysphoric mood. Negative for agitation, behavioral problems, confusion, decreased concentration, hallucinations, self-injury, sleep disturbance and suicidal ideas. The patient is not nervous/anxious and is not hyperactive.   All other systems reviewed and are negative.     Objective    BP (!) 145/89   Pulse 82   Temp 97.8 F (36.6 C) (Oral)   Resp 16   Ht 5\' 5"  (1.651 m)   Wt 112 lb 3.2 oz (50.9 kg)   SpO2 100%   BMI 18.67 kg/m  Physical Exam Vitals reviewed.  Constitutional:      General: She is not in acute distress.    Appearance: She is well-developed. She is not diaphoretic.     Interventions: She is not intubated. HENT:     Head: Normocephalic and atraumatic.     Right Ear: External ear normal.     Left Ear: External ear normal.     Nose: Nose normal.     Mouth/Throat:     Pharynx: No oropharyngeal exudate.  Eyes:     General: Lids are normal. No scleral icterus.       Right eye: No discharge.        Left eye: No discharge.     Conjunctiva/sclera: Conjunctivae normal.      Right eye: Right conjunctiva is not injected. No exudate or hemorrhage.    Left eye: Left conjunctiva is not injected. No exudate or hemorrhage.    Pupils: Pupils are equal, round, and reactive to light.  Neck:     Thyroid: No thyroid mass or thyromegaly.     Vascular: Normal carotid pulses. No carotid bruit, hepatojugular reflux or JVD.     Trachea: Trachea and phonation normal. No tracheal tenderness or tracheal deviation.     Meningeal: Brudzinski's sign and Kernig's sign absent.  Cardiovascular:     Rate and Rhythm: Normal rate and regular rhythm.     Pulses: Normal pulses.  Radial pulses are 2+ on the right side and 2+ on the left side.       Dorsalis pedis pulses are 2+ on the right side and 2+ on the left side.       Posterior tibial pulses are 2+ on the right side and 2+ on the left side.     Heart sounds: Normal heart sounds, S1 normal and S2 normal. Heart sounds not distant. No murmur heard. No friction rub. No gallop.   Pulmonary:     Effort: Pulmonary effort is normal. No tachypnea, bradypnea, accessory muscle usage or respiratory distress. She is not intubated.     Breath sounds: Normal breath sounds. No stridor. No wheezing or rales.  Chest:     Chest wall: No tenderness.  Breasts:     Right: No supraclavicular adenopathy.     Left: No supraclavicular adenopathy.    Abdominal:     General: Bowel sounds are normal. There is no distension or abdominal bruit.     Palpations: Abdomen is soft. There is no shifting dullness, fluid wave, hepatomegaly, splenomegaly, mass or pulsatile mass.     Tenderness: There is no abdominal tenderness. There is no guarding or rebound.     Hernia: No hernia is present.  Musculoskeletal:        General: No tenderness or deformity. Normal range of motion.     Cervical back: Full passive range of motion without pain, normal range of motion and neck supple. No edema, erythema or rigidity. No spinous process tenderness or muscular  tenderness. Normal range of motion.  Lymphadenopathy:     Head:     Right side of head: No submental, submandibular, tonsillar, preauricular, posterior auricular or occipital adenopathy.     Left side of head: No submental, submandibular, tonsillar, preauricular, posterior auricular or occipital adenopathy.     Cervical: No cervical adenopathy.     Right cervical: No superficial, deep or posterior cervical adenopathy.    Left cervical: No superficial, deep or posterior cervical adenopathy.     Upper Body:     Right upper body: No supraclavicular or pectoral adenopathy.     Left upper body: No supraclavicular or pectoral adenopathy.  Skin:    General: Skin is warm and dry.     Coloration: Skin is not pale.     Findings: No abrasion, bruising, burn, ecchymosis, erythema, lesion, petechiae or rash.     Nails: There is no clubbing.  Neurological:     Mental Status: She is alert and oriented to person, place, and time.     GCS: GCS eye subscore is 4. GCS verbal subscore is 5. GCS motor subscore is 6.     Cranial Nerves: No cranial nerve deficit.     Sensory: No sensory deficit.     Motor: No tremor, atrophy, abnormal muscle tone or seizure activity.     Coordination: Coordination normal.     Gait: Gait normal.     Deep Tendon Reflexes: Reflexes are normal and symmetric. Reflexes normal. Babinski sign absent on the right side. Babinski sign absent on the left side.     Reflex Scores:      Tricep reflexes are 2+ on the right side and 2+ on the left side.      Bicep reflexes are 2+ on the right side and 2+ on the left side.      Brachioradialis reflexes are 2+ on the right side and 2+ on the left side.  Patellar reflexes are 2+ on the right side and 2+ on the left side.      Achilles reflexes are 2+ on the right side and 2+ on the left side. Psychiatric:        Speech: Speech normal.        Behavior: Behavior normal.        Thought Content: Thought content normal.        Judgment:  Judgment normal.     Depression Screen PHQ 2/9 Scores 12/24/2020  PHQ - 2 Score 1  PHQ- 9 Score 2   Results for orders placed or performed in visit on 12/24/20  POCT urinalysis dipstick  Result Value Ref Range   Color, UA yellow    Clarity, UA clear    Glucose, UA Negative Negative   Bilirubin, UA negative    Ketones, UA negative    Spec Grav, UA 1.010 1.010 - 1.025   Blood, UA negative    pH, UA 5.0 5.0 - 8.0   Protein, UA Negative Negative   Urobilinogen, UA 0.2 0.2 or 1.0 E.U./dL   Nitrite, UA negative    Leukocytes, UA Negative Negative   Appearance     Odor      Assessment & Plan     Depression, unspecified depression type - Plan: CBC with Differential/Platelet, Comprehensive Metabolic Panel (CMET), TSH, Lipid Panel w/o Chol/HDL Ratio  Screening mammogram, encounter for - Plan: MM Digital Screening  Screening for blood or protein in urine - Plan: POCT urinalysis dipstick  Tobacco use disorder - Plan: CT CHEST LUNG CA SCREEN LOW DOSE W/O CM  Encounter for screening for malignant neoplasm of lung in current smoker with 30 pack year history or greater  Meds ordered this encounter  Medications  . buPROPion (WELLBUTRIN XL) 300 MG 24 hr tablet    Sig: Take 1 tablet (300 mg total) by mouth daily.    Dispense:  90 tablet    Refill:  3   Red Flags discussed. The patient was given clear instructions to go to ER or return to medical center if any red flags develop, symptoms do not improve, worsen or new problems develop. They verbalized understanding.   Return in about 6 months (around 06/23/2021), or if symptoms worsen or fail to improve, for at any time for any worsening symptoms, Go to Emergency room/ urgent care if worse.     The entirety of the information documented in the History of Present Illness, Review of Systems and Physical Exam were personally obtained by me. Portions of this information were initially documented by the CMA and reviewed by me for thoroughness  and accuracy.    Marcille Buffy, [place] [phone] (phone) [phone] (fax)  [place]

## 2020-12-24 NOTE — Patient Instructions (Addendum)
Call to schedule your screening mammogram. Your orders have been placed for your exam.  Let our office know if you have questions, concerns, or any difficulty scheduling.  If normal results then yearly screening mammograms are recommended unless you notice  Changes in your breast then you should schedule a follow up office visit. If abnormal results  Further imaging will be warranted and sooner follow up as determined by the radiologist at the Breast Center.   [facility] [address] [place], [place] [address]  Main: [phone]     Health Maintenance, Female Adopting a healthy lifestyle and getting preventive care are important in promoting health and wellness. Ask your health care provider about:  The right schedule for you to have regular tests and exams.  Things you can do on your own to prevent diseases and keep yourself healthy. What should I know about diet, weight, and exercise? Eat a healthy diet  Eat a diet that includes plenty of vegetables, fruits, low-fat dairy products, and lean protein.  Do not eat a lot of foods that are high in solid fats, added sugars, or sodium.   Maintain a healthy weight Body mass index (BMI) is used to identify weight problems. It estimates body fat based on height and weight. Your health care provider can help determine your BMI and help you achieve or maintain a healthy weight. Get regular exercise Get regular exercise. This is one of the most important things you can do for your health. Most adults should:  Exercise for at least 150 minutes each week. The exercise should increase your heart rate and make you sweat (moderate-intensity exercise).  Do strengthening exercises at least twice a week. This is in addition to the moderate-intensity exercise.  Spend less time sitting. Even light physical activity can be beneficial. Watch cholesterol and blood lipids Have your blood tested for lipids and  cholesterol at 72 years of age, then have this test every 5 years. Have your cholesterol levels checked more often if:  Your lipid or cholesterol levels are high.  You are older than 72 years of age.  You are at high risk for heart disease. What should I know about cancer screening? Depending on your health history and family history, you may need to have cancer screening at various ages. This may include screening for:  Breast cancer.  Cervical cancer.  Colorectal cancer.  Skin cancer.  Lung cancer. What should I know about heart disease, diabetes, and high blood pressure? Blood pressure and heart disease  High blood pressure causes heart disease and increases the risk of stroke. This is more likely to develop in people who have high blood pressure readings, are of African descent, or are overweight.  Have your blood pressure checked: ? Every 3-5 years if you are 18-39 years of age. ? Every year if you are 40 years old or older. Diabetes Have regular diabetes screenings. This checks your fasting blood sugar level. Have the screening done:  Once every three years after age 40 if you are at a normal weight and have a low risk for diabetes.  More often and at a younger age if you are overweight or have a high risk for diabetes. What should I know about preventing infection? Hepatitis B If you have a higher risk for hepatitis B, you should be screened for this virus. Talk with your health care provider to find out if you are at risk for hepatitis B infection. Hepatitis   C Testing is recommended for:  Everyone born from [birth year] through 1967.  Anyone with known risk factors for hepatitis C. Sexually transmitted infections (STIs)  Get screened for STIs, including gonorrhea and chlamydia, if: ? You are sexually active and are younger than 72 years of age. ? You are older than 72 years of age and your health care provider tells you that you are at risk for this type of  infection. ? Your sexual activity has changed since you were last screened, and you are at increased risk for chlamydia or gonorrhea. Ask your health care provider if you are at risk.  Ask your health care provider about whether you are at high risk for HIV. Your health care provider may recommend a prescription medicine to help prevent HIV infection. If you choose to take medicine to prevent HIV, you should first get tested for HIV. You should then be tested every 3 months for as long as you are taking the medicine. Pregnancy  If you are about to stop having your period (premenopausal) and you may become pregnant, seek counseling before you get pregnant.  Take 400 to 800 micrograms (mcg) of folic acid every day if you become pregnant.  Ask for birth control (contraception) if you want to prevent pregnancy. Osteoporosis and menopause Osteoporosis is a disease in which the bones lose minerals and strength with aging. This can result in bone fractures. If you are 76 years old or older, or if you are at risk for osteoporosis and fractures, ask your health care provider if you should:  Be screened for bone loss.  Take a calcium or vitamin D supplement to lower your risk of fractures.  Be given hormone replacement therapy (HRT) to treat symptoms of menopause. Follow these instructions at home: Lifestyle  Do not use any products that contain nicotine or tobacco, such as cigarettes, e-cigarettes, and chewing tobacco. If you need help quitting, ask your health care provider.  Do not use street drugs.  Do not share needles.  Ask your health care provider for help if you need support or information about quitting drugs. Alcohol use  Do not drink alcohol if: ? Your health care provider tells you not to drink. ? You are pregnant, may be pregnant, or are planning to become pregnant.  If you drink alcohol: ? Limit how much you use to 0-1 drink a day. ? Limit intake if you are  breastfeeding.  Be aware of how much alcohol is in your drink. In the [place] one drink equals one 12 oz bottle of beer (355 mL), one 5 oz glass of wine (148 mL), or one 1 oz glass of hard liquor (44 mL). General instructions  Schedule regular health, dental, and eye exams.  Stay current with your vaccines.  Tell your health care provider if: ? You often feel depressed. ? You have ever been abused or do not feel safe at home. Summary  Adopting a healthy lifestyle and getting preventive care are important in promoting health and wellness.  Follow your health care provider's instructions about healthy diet, exercising, and getting tested or screened for diseases.  Follow your health care provider's instructions on monitoring your cholesterol and blood pressure. This information is not intended to replace advice given to you by your health care provider. Make sure you discuss any questions you have with your health care provider. Document Revised: 11/06/2018 Document Reviewed: 11/06/2018 Elsevier Patient Education  2021 [employer] Screening A lung cancer screening  is a test that checks for lung cancer. Lung cancer screening is done to look for lung cancer in its very early stages when you are not likely to have any symptoms and before it spreads beyond the lung, making it harder to treat. Finding cancer early improves the chances of successful treatment. It may save your life. Who should have screening? You should be screened for lung cancer if all of these apply:  You currently smoke or you have quit smoking within the past 15 years.  You are 97-45 years old. Screening may be recommended up to age 11 depending on your overall health and other factors.  You are in good general health.  You have a smoking history of 1 pack of cigarettes a day for 20 years or 2 packs a day for 10 years. Screening may also be recommended if you are at high risk for the disease. You may be  at high risk if:  You have a family history of lung cancer.  You have been exposed to asbestos or radon.  You have chronic obstructive pulmonary disease (COPD). How is screening done? The recommended screening test is a low-dose computed tomography (LDCT) scan. This scan takes detailed images of the lungs. This allows a health care provider to look for abnormal cells. If you are at risk for lung cancer, it is recommended that you get screened once a year. Talk to your health care provider about the risks, benefits, and limitations of screening.   What are the benefits of screening? Screening can find lung cancer early, before symptoms start and before it has spread outside of the lungs. The chances of curing lung cancer are greater if the cancer is diagnosed early. What are the risks of screening?  The screening may show lung cancer when no cancer is present (false-positive result).  The screening may not find lung cancer when it is present.  The person gets exposed to radiation. How can I lower my risk of lung cancer? Make these lifestyle changes to lower your risk of developing lung cancer:  Do not use any products that contain nicotine or tobacco, such as cigarettes, e-cigarettes, and chewing tobacco. If you need help quitting, ask your health care provider.  Avoid secondhand smoke.  Avoid exposure to radiation.  Avoid exposure to radon gas. Have your home checked for radon regularly.  Avoid things that cause cancer (carcinogens).  Avoid living or working in places with high air pollution. Questions to ask your health care provider  Am I eligible for lung cancer screening?  Does my health insurance cover the cost of lung cancer screening?  What happens if the lung cancer screening shows something of concern?  How soon will I have results from my lung cancer screening?  Is there anything that I need to do to prepare for my lung cancer screening?  What happens if I decide  not to have lung cancer screening? Where to find more information Ask your health care provider about the risks and benefits of screening. More information and resources are available from these organizations:  [employer] (ACS): www.cancer.org  American Lung Association: www.lung.org Contact a health care provider if:  You start to show symptoms of lung cancer, including: ? Coughing that will not go away. ? Making whistling sounds when you breathe (wheezing). ? Chest pain. ? Coughing up blood. ? Shortness of breath. ? Weight loss that cannot be explained. ? Constant tiredness (fatigue). ? Hoarse voice. Summary  Lung cancer  screening may find lung cancer before symptoms appear. Finding cancer early improves the chances of successful treatment. It may save your life.  The recommended screening test is a low-dose computed tomography (LDCT) scan that looks for abnormal cells in the lungs. If you are at risk for lung cancer, it is recommended that you get screened once a year.  You can make lifestyle changes to lower your risk of lung cancer.  Ask your health care provider about the risks and benefits of screening. This information is not intended to replace advice given to you by your health care provider. Make sure you discuss any questions you have with your health care provider. Document Revised: 04/05/2020 Document Reviewed: 11/11/2019 Elsevier Patient Education  2021 [employer].

## 2020-12-26 ENCOUNTER — Encounter: Payer: Self-pay | Admitting: Adult Health

## 2020-12-26 DIAGNOSIS — Z1231 Encounter for screening mammogram for malignant neoplasm of breast: Secondary | ICD-10-CM | POA: Insufficient documentation

## 2021-01-05 ENCOUNTER — Telehealth: Payer: Self-pay | Admitting: *Deleted

## 2021-01-05 NOTE — Telephone Encounter (Signed)
Received referral for low dose lung cancer screening CT scan. Message left at phone number listed in EMR for patient to call me back to facilitate scheduling scan.  

## 2021-01-19 ENCOUNTER — Ambulatory Visit
Admission: RE | Admit: 2021-01-19 | Discharge: 2021-01-19 | Disposition: A | Discharge status: Home / Self Care | Payer: Medicare PPO | Source: Ambulatory Visit | Attending: Adult Health | Admitting: Adult Health

## 2021-01-19 ENCOUNTER — Other Ambulatory Visit: Payer: Self-pay

## 2021-01-19 DIAGNOSIS — Z1231 Encounter for screening mammogram for malignant neoplasm of breast: Secondary | ICD-10-CM | POA: Diagnosis not present

## 2021-01-22 ENCOUNTER — Encounter: Payer: Self-pay | Admitting: Adult Health

## 2021-01-24 NOTE — Progress Notes (Signed)
Screening in one year mammogram unless clinically indicated sooner.

## 2021-01-25 NOTE — Progress Notes (Signed)
Subjective:   Sandra Franco is a 70 y.o. female who presents for an Initial Medicare Annual Wellness Visit.  I connected with Sandra Franco today by telephone and verified that I am speaking with the correct person using two identifiers. Location patient: home Location provider: work Persons participating in the virtual visit: patient, provider.   I discussed the limitations, risks, security and privacy concerns of performing an evaluation and management service by telephone and the availability of in person appointments. I also discussed with the patient that there may be a patient responsible charge related to this service. The patient expressed understanding and verbally consented to this telephonic visit.    Interactive audio and video telecommunications were attempted between this provider and patient, however failed, due to patient having technical difficulties OR patient did not have access to video capability.  We continued and completed visit with audio only.   Review of Systems    N/A  Cardiac Risk Factors include: advanced age (>65men, >7 women);smoking/ tobacco exposure;sedentary lifestyle     Objective:    There were no vitals filed for this visit. There is no height or weight on file to calculate BMI.  Advanced Directives 01/26/2021 08/09/2018  Does Patient Have a Medical Advance Directive? Yes Yes  Type of Paramedic of [place];Living will [place];Living will  Copy of [place] in Chart? No - copy requested No - copy requested    Current Medications (verified) Outpatient Encounter Medications as of 01/26/2021  Medication Sig  . buPROPion (WELLBUTRIN XL) 300 MG 24 hr tablet Take 1 tablet (300 mg total) by mouth daily.  . Calcium Citrate-Vitamin D (CALCIUM + D PO) Take 1 capsule by mouth daily.  Marland Kitchen ibuprofen (ADVIL) 200 MG tablet Take 200 mg by mouth every 6 (six) hours as needed.  . Multiple Vitamin  (MULTIVITAMIN) capsule Take 1 capsule by mouth daily.   No facility-administered encounter medications on file as of 01/26/2021.    Allergies (verified) Amoxicillin, Cefdinir, and Codeine   History: Past Medical History:  Diagnosis Date  . Depression   . Heavy alcohol consumption   . Mood disorder ([place])   . Osteopenia   . Tobacco use disorder   . Tubular adenoma of colon 2019   needs another colonoscopy in 3 years   Past Surgical History:  Procedure Laterality Date  . BREAST BIOPSY Right 2014   NEG  . COLONOSCOPY  09/23/2012   negative  . COLONOSCOPY WITH PROPOFOL N/A 08/09/2018   Procedure: COLONOSCOPY WITH PROPOFOL;  Surgeon: Manya Silvas, MD;  Location[facility] ENDOSCOPY;  Service: Endoscopy;  Laterality: N/A;  . FINGER FRACTURE SURGERY Right 1984  . INDUCED ABORTION    . TONSILLECTOMY     Family History  Problem Relation Age of Onset  . Rheum arthritis Mother   . Hypertension Mother   . Lung cancer Father 41  . Pancreatic cancer Maternal Aunt 75  . Breast cancer Neg Hx    Social History   Socioeconomic History  . Marital status: Married    Spouse name: Sandra Franco  . Number of children: 1  . Years of education: LPN  . Highest education level: Some college, no degree  Occupational History  . Occupation: retired Control and instrumentation engineer  Tobacco Use  . Smoking status: Current Every Day Smoker    Packs/day: 0.50    Types: Cigarettes  . Smokeless tobacco: Never Used  Vaping Use  . Vaping Use: Never used  Substance  and Sexual Activity  . Alcohol use: Yes    Alcohol/week: 14.0 - 21.0 standard drinks    Types: 14 - 21 Cans of beer per week    Comment: 2-3  . Drug use: No  . Sexual activity: Not Currently    Birth control/protection: Post-menopausal  Other Topics Concern  . Not on file  Social History Narrative  . Not on file   Social Determinants of Health   Financial Resource Strain: Low Risk   . Difficulty of Paying Living Expenses: Not hard at all  Food  Insecurity: No Food Insecurity  . Worried About Charity fundraiser in the Last Year: Never true  . Ran Out of Food in the Last Year: Never true  Transportation Needs: No Transportation Needs  . Lack of Transportation (Medical): No  . Lack of Transportation (Non-Medical): No  Physical Activity: Inactive  . Days of Exercise per Week: 0 days  . Minutes of Exercise per Session: 0 min  Stress: Stress Concern Present  . Feeling of Stress : To some extent  Social Connections: Moderately Isolated  . Frequency of Communication with Friends and Family: Twice a week  . Frequency of Social Gatherings with Friends and Family: More than three times a week  . Attends Religious Services: Never  . Active Member of Clubs or Organizations: No  . Attends Archivist Meetings: Never  . Marital Status: Married    Tobacco Counseling Ready to quit: No Counseling given: No   Clinical Intake:  Pre-visit preparation completed: Yes  Pain : No/denies pain     Nutritional Risks: None Diabetes: No  How often do you need to have someone help you when you read instructions, pamphlets, or other written materials from your doctor or pharmacy?: 1 - Never  Diabetic? No  Interpreter Needed?: No  Information entered by :: [facility] [address], LPN   Activities of Daily Living In your present state of health, do you have any difficulty performing the following activities: 01/26/2021 12/24/2020  Hearing? N N  Vision? N Y  Difficulty concentrating or making decisions? N Y  Walking or climbing stairs? N N  Dressing or bathing? N N  Doing errands, shopping? N N  Preparing Food and eating ? N -  Using the Toilet? N -  In the past six months, have you accidently leaked urine? N -  Do you have problems with loss of bowel control? N -  Managing your Medications? N -  Managing your Finances? N -  Housekeeping or managing your Housekeeping? N -  Some recent data might be hidden    Patient Care  Team: Franco, Sandra Aline, FNP as PCP - General (Family Medicine) [place], [place] any recent Medical Services you may have received from other than [facility] providers in the past year (date may be approximate).     Assessment:   This is a routine wellness examination for [name].  Hearing/Vision screen No exam data present  Dietary issues and exercise activities discussed: Current Exercise Habits: The patient does not participate in regular exercise at present, Exercise limited by: None identified  Goals    . Quit Smoking     Recommend to continue efforts to reduce smoking habits until no longer smoking.       Depression Screen PHQ 2/9 Scores 12/24/2020  PHQ - 2 Score 1  PHQ- 9 Score 2    Fall Risk Fall Risk  01/26/2021 12/24/2020  Falls in the past  year? 0 0  Number falls in past yr: 0 0  Injury with Fall? 0 0    FALL RISK PREVENTION PERTAINING TO THE HOME:  Any stairs in or around the home? Yes  If so, are there any without handrails? No  Home free of loose throw rugs in walkways, pet beds, electrical cords, etc? Yes  Adequate lighting in your home to reduce risk of falls? Yes   ASSISTIVE DEVICES UTILIZED TO PREVENT FALLS:  Life alert? No  Use of a cane, walker or w/c? No  Grab bars in the bathroom? No  Shower chair or bench in shower? No  Elevated toilet seat or a handicapped toilet? No   Cognitive Function:      6CIT Screen 01/26/2021  What Year? 0 points  What month? 0 points  What time? 0 points  Count back from 20 0 points  Months in reverse 0 points  Repeat phrase 0 points  Total Score 0    Immunizations Immunization History  Administered Date(s) Administered  . Fluad Quad(high Dose 65+) 08/21/2019  . Influenza, High Dose Seasonal PF 09/08/2018, 08/27/2020  . Influenza-Unspecified 08/24/2017, 09/08/2018, 08/21/2019  . PFIZER(Purple Top)SARS-COV-2 Vaccination 01/19/2020, 02/09/2020, 09/06/2020  . Pneumococcal  Polysaccharide-23 09/12/2013  . Tdap 01/27/2011    TDAP status: Due, Education has been provided regarding the importance of this vaccine. Advised may receive this vaccine at local pharmacy or Health Dept. Aware to provide a copy of the vaccination record if obtained from local pharmacy or Health Dept. Verbalized acceptance and understanding.  Flu Vaccine status: Up to date  Pneumococcal vaccine status: Due, Education has been provided regarding the importance of this vaccine. Advised may receive this vaccine at local pharmacy or Health Dept. Aware to provide a copy of the vaccination record if obtained from local pharmacy or Health Dept. Verbalized acceptance and understanding.  Covid-19 vaccine status: Completed vaccines  Qualifies for Shingles Vaccine? Yes   Zostavax completed No   Shingrix Completed?: No.    Education has been provided regarding the importance of this vaccine. Patient has been advised to call insurance company to determine out of pocket expense if they have not yet received this vaccine. Advised may also receive vaccine at local pharmacy or Health Dept. Verbalized acceptance and understanding.  Screening Tests Health Maintenance  Topic Date Due  . Hepatitis C Screening  Never done  . TETANUS/TDAP  01/26/2022 (Originally 01/26/2021)  . PNA vac Low Risk Adult (1 of 2 - PCV13) 01/26/2022 (Originally 09/12/2014)  . MAMMOGRAM  01/19/2023  . COLONOSCOPY (Pts 45-79yrs Insurance coverage will need to be confirmed)  08/10/2023  . INFLUENZA VACCINE  Completed  . DEXA SCAN  Completed  . COVID-19 Vaccine  Completed  . HPV VACCINES  Aged Out    Health Maintenance  Health Maintenance Due  Topic Date Due  . Hepatitis C Screening  Never done    Colorectal cancer screening: Type of screening: Colonoscopy. Completed 08/09/18. Repeat every 5 years  Mammogram status: Completed 01/19/21. Repeat every year  Bone Density status: Completed 10/03/17. Results reflect: Bone density  results: OSTEOPENIA. Repeat every 5 years.  Lung Cancer Screening: (Low Dose CT Chest recommended if Age 74-80 years, 30 pack-year currently smoking OR have quit w/in 15years.) does qualify and CT scan already on file. Pt to call and schedule scan.   Additional Screening:  Hepatitis C Screening: does qualify and would like to add this lab to next in office blood work orders.   Vision Screening: Recommended  annual ophthalmology exams for early detection of glaucoma and other disorders of the eye. Is the patient up to date with their annual eye exam?  Yes  Who is the provider or what is the name of the office in which the patient attends annual eye exams? [facility] If pt is not established with a provider, would they like to be referred to a provider to establish care? No .   Dental Screening: Recommended annual dental exams for proper oral hygiene  Community Resource Referral / Chronic Care Management: CRR required this visit?  No   CCM required this visit?  No      Plan:     I have personally reviewed and noted the following in the patient's chart:   . Medical and social history . Use of alcohol, tobacco or illicit drugs  . Current medications and supplements . Functional ability and status . Nutritional status . Physical activity . Advanced directives . List of other physicians . Hospitalizations, surgeries, and ER visits in previous 12 months . Vitals . Screenings to include cognitive, depression, and falls . Referrals and appointments  In addition, I have reviewed and discussed with patient certain preventive protocols, quality metrics, and best practice recommendations. A written personalized care plan for preventive services as well as general preventive health recommendations were provided to patient.     Nasire Reali [place], [place]   01/29/3613   Nurse Notes: Pt would like to have the Hep C lab order added to next in office blood work orders. Pt declined receiving a  future Prevnar 13 vaccine.

## 2021-01-26 ENCOUNTER — Ambulatory Visit (INDEPENDENT_AMBULATORY_CARE_PROVIDER_SITE_OTHER): Payer: Medicare PPO

## 2021-01-26 ENCOUNTER — Other Ambulatory Visit: Payer: Self-pay

## 2021-01-26 DIAGNOSIS — Z0000 Encounter for general adult medical examination without abnormal findings: Secondary | ICD-10-CM

## 2021-01-26 NOTE — Patient Instructions (Signed)
Sandra Franco , Thank you for taking time to come for your Medicare Wellness Visit. I appreciate your ongoing commitment to your health goals. Please review the following plan we discussed and let me know if I can assist you in the future.   Screening recommendations/referrals: Colonoscopy: Up to date, due 07/2023 Mammogram: Up to date, due 12/2021 Bone Density: Up to date, due 09/2022 Recommended yearly ophthalmology/optometry visit for glaucoma screening and checkup Recommended yearly dental visit for hygiene and checkup  Vaccinations: Influenza vaccine: Done 08/2020 Pneumococcal vaccine: Prevnar 13 due- declined receiving/  Tdap vaccine: Currently due, declined receiving.  Shingles vaccine: Shingrix discussed. Please contact your pharmacy for coverage information.     Advanced directives: Please bring a copy of your POA (Power of Attorney) and/or Living Will to your next appointment.   Conditions/risks identified: Smoking cessation discussed today.   Next appointment: None, declined scheduling a follow up with PCP or an AWV for 2023 at this time.    Preventive Care 72 Years and Older, Female Preventive care refers to lifestyle choices and visits with your health care provider that can promote health and wellness. What does preventive care include?  A yearly physical exam. This is also called an annual well check.  Dental exams once or twice a year.  Routine eye exams. Ask your health care provider how often you should have your eyes checked.  Personal lifestyle choices, including:  Daily care of your teeth and gums.  Regular physical activity.  Eating a healthy diet.  Avoiding tobacco and drug use.  Limiting alcohol use.  Practicing safe sex.  Taking low-dose aspirin every day.  Taking vitamin and mineral supplements as recommended by your health care provider. What happens during an annual well check? The services and screenings done by your health care provider  during your annual well check will depend on your age, overall health, lifestyle risk factors, and family history of disease. Counseling  Your health care provider may ask you questions about your:  Alcohol use.  Tobacco use.  Drug use.  Emotional well-being.  Home and relationship well-being.  Sexual activity.  Eating habits.  History of falls.  Memory and ability to understand (cognition).  Work and work Statistician.  Reproductive health. Screening  You may have the following tests or measurements:  Height, weight, and BMI.  Blood pressure.  Lipid and cholesterol levels. These may be checked every 5 years, or more frequently if you are over 26 years old.  Skin check.  Lung cancer screening. You may have this screening every year starting at age 1 if you have a 30-pack-year history of smoking and currently smoke or have quit within the past 15 years.  Fecal occult blood test (FOBT) of the stool. You may have this test every year starting at age 35.  Flexible sigmoidoscopy or colonoscopy. You may have a sigmoidoscopy every 5 years or a colonoscopy every 10 years starting at age 34.  Hepatitis C blood test.  Hepatitis B blood test.  Sexually transmitted disease (STD) testing.  Diabetes screening. This is done by checking your blood sugar (glucose) after you have not eaten for a while (fasting). You may have this done every 1-3 years.  Bone density scan. This is done to screen for osteoporosis. You may have this done starting at age 20.  Mammogram. This may be done every 1-2 years. Talk to your health care provider about how often you should have regular mammograms. Talk with your health care provider about  your test results, treatment options, and if necessary, the need for more tests. Vaccines  Your health care provider may recommend certain vaccines, such as:  Influenza vaccine. This is recommended every year.  Tetanus, diphtheria, and acellular pertussis  (Tdap, Td) vaccine. You may need a Td booster every 10 years.  Zoster vaccine. You may need this after age 59.  Pneumococcal 13-valent conjugate (PCV13) vaccine. One dose is recommended after age 31.  Pneumococcal polysaccharide (PPSV23) vaccine. One dose is recommended after age 52. Talk to your health care provider about which screenings and vaccines you need and how often you need them. This information is not intended to replace advice given to you by your health care provider. Make sure you discuss any questions you have with your health care provider. Document Released: 12/10/2015 Document Revised: 08/02/2016 Document Reviewed: 09/14/2015 Elsevier Interactive Patient Education  2017 [place] Prevention in the Home Falls can cause injuries. They can happen to people of all ages. There are many things you can do to make your home safe and to help prevent falls. What can I do on the outside of my home?  Regularly fix the edges of walkways and driveways and fix any cracks.  Remove anything that might make you trip as you walk through a door, such as a raised step or threshold.  Trim any bushes or trees on the path to your home.  Use bright outdoor lighting.  Clear any walking paths of anything that might make someone trip, such as rocks or tools.  Regularly check to see if handrails are loose or broken. Make sure that both sides of any steps have handrails.  Any raised decks and porches should have guardrails on the edges.  Have any leaves, snow, or ice cleared regularly.  Use sand or salt on walking paths during winter.  Clean up any spills in your garage right away. This includes oil or grease spills. What can I do in the bathroom?  Use night lights.  Install grab bars by the toilet and in the tub and shower. Do not use towel bars as grab bars.  Use non-skid mats or decals in the tub or shower.  If you need to sit down in the shower, use a plastic, non-slip  stool.  Keep the floor dry. Clean up any water that spills on the floor as soon as it happens.  Remove soap buildup in the tub or shower regularly.  Attach bath mats securely with double-sided non-slip rug tape.  Do not have throw rugs and other things on the floor that can make you trip. What can I do in the bedroom?  Use night lights.  Make sure that you have a light by your bed that is easy to reach.  Do not use any sheets or blankets that are too big for your bed. They should not hang down onto the floor.  Have a firm chair that has side arms. You can use this for support while you get dressed.  Do not have throw rugs and other things on the floor that can make you trip. What can I do in the kitchen?  Clean up any spills right away.  Avoid walking on wet floors.  Keep items that you use a lot in easy-to-reach places.  If you need to reach something above you, use a strong step stool that has a grab bar.  Keep electrical cords out of the way.  Do not use floor polish or wax  that makes floors slippery. If you must use wax, use non-skid floor wax.  Do not have throw rugs and other things on the floor that can make you trip. What can I do with my stairs?  Do not leave any items on the stairs.  Make sure that there are handrails on both sides of the stairs and use them. Fix handrails that are broken or loose. Make sure that handrails are as long as the stairways.  Check any carpeting to make sure that it is firmly attached to the stairs. Fix any carpet that is loose or worn.  Avoid having throw rugs at the top or bottom of the stairs. If you do have throw rugs, attach them to the floor with carpet tape.  Make sure that you have a light switch at the top of the stairs and the bottom of the stairs. If you do not have them, ask someone to add them for you. What else can I do to help prevent falls?  Wear shoes that:  Do not have high heels.  Have rubber bottoms.  Are  comfortable and fit you well.  Are closed at the toe. Do not wear sandals.  If you use a stepladder:  Make sure that it is fully opened. Do not climb a closed stepladder.  Make sure that both sides of the stepladder are locked into place.  Ask someone to hold it for you, if possible.  Clearly mark and make sure that you can see:  Any grab bars or handrails.  First and last steps.  Where the edge of each step is.  Use tools that help you move around (mobility aids) if they are needed. These include:  Canes.  Walkers.  Scooters.  Crutches.  Turn on the lights when you go into a dark area. Replace any light bulbs as soon as they burn out.  Set up your furniture so you have a clear path. Avoid moving your furniture around.  If any of your floors are uneven, fix them.  If there are any pets around you, be aware of where they are.  Review your medicines with your doctor. Some medicines can make you feel dizzy. This can increase your chance of falling. Ask your doctor what other things that you can do to help prevent falls. This information is not intended to replace advice given to you by your health care provider. Make sure you discuss any questions you have with your health care provider. Document Released: 09/09/2009 Document Revised: 04/20/2016 Document Reviewed: 12/18/2014 Elsevier Interactive Patient Education  2017 [employer].

## 2021-02-10 ENCOUNTER — Encounter: Payer: Self-pay | Admitting: *Deleted

## 2021-02-10 DIAGNOSIS — Z122 Encounter for screening for malignant neoplasm of respiratory organs: Secondary | ICD-10-CM

## 2021-02-10 DIAGNOSIS — Z87891 Personal history of nicotine dependence: Secondary | ICD-10-CM

## 2021-02-10 DIAGNOSIS — F17200 Nicotine dependence, unspecified, uncomplicated: Secondary | ICD-10-CM

## 2021-02-14 NOTE — Telephone Encounter (Signed)
Received referral for initial lung cancer screening scan. Contacted patient and obtained smoking history,(current, 25.5 pack year) as well as answering questions related to screening process. Patient denies signs of lung cancer such as weight loss or hemoptysis. Patient denies comorbidity that would prevent curative treatment if lung cancer were found. Patient is scheduled for shared decision making visit and CT scan on 03/02/21 at 1045am.

## 2021-03-02 ENCOUNTER — Other Ambulatory Visit: Payer: Self-pay

## 2021-03-02 ENCOUNTER — Inpatient Hospital Stay: Payer: Medicare PPO | Attending: Oncology | Admitting: Oncology

## 2021-03-02 ENCOUNTER — Encounter: Payer: Self-pay | Admitting: Oncology

## 2021-03-02 ENCOUNTER — Ambulatory Visit
Admission: RE | Admit: 2021-03-02 | Discharge: 2021-03-02 | Disposition: A | Discharge status: Home / Self Care | Payer: Medicare PPO | Source: Ambulatory Visit | Attending: Oncology | Admitting: Oncology

## 2021-03-02 DIAGNOSIS — Z87891 Personal history of nicotine dependence: Secondary | ICD-10-CM

## 2021-03-02 DIAGNOSIS — Z122 Encounter for screening for malignant neoplasm of respiratory organs: Secondary | ICD-10-CM | POA: Diagnosis not present

## 2021-03-02 DIAGNOSIS — F17210 Nicotine dependence, cigarettes, uncomplicated: Secondary | ICD-10-CM

## 2021-03-02 DIAGNOSIS — F17200 Nicotine dependence, unspecified, uncomplicated: Secondary | ICD-10-CM | POA: Insufficient documentation

## 2021-03-02 NOTE — Progress Notes (Signed)
Virtual Visit via Video Note  I connected with Sandra Franco on 03/02/21 at 10:45 AM EDT by a video enabled telemedicine application and verified that I am speaking with the correct person using two identifiers.  Location: Patient: [facility] Provider: Clinic   I discussed the limitations of evaluation and management by telemedicine and the availability of in person appointments. The patient expressed understanding and agreed to proceed.  I discussed the assessment and treatment plan with the patient. The patient was provided an opportunity to ask questions and all were answered. The patient agreed with the plan and demonstrated an understanding of the instructions.   The patient was advised to call back or seek an in-person evaluation if the symptoms worsen or if the condition fails to improve as anticipated.   In accordance with CMS guidelines, patient has met eligibility criteria including age, absence of signs or symptoms of lung cancer.  Social History   Tobacco Use  . Smoking status: Current Every Day Smoker    Packs/day: 0.50    Years: 51.00    Pack years: 25.50    Types: Cigarettes  . Smokeless tobacco: Never Used  Vaping Use  . Vaping Use: Never used  Substance Use Topics  . Alcohol use: Yes    Alcohol/week: 14.0 - 21.0 standard drinks    Types: 14 - 21 Cans of beer per week    Comment: 2-3  . Drug use: No      A shared decision-making session was conducted prior to the performance of CT scan. This includes one or more decision aids, includes benefits and harms of screening, follow-up diagnostic testing, over-diagnosis, false positive rate, and total radiation exposure.   Counseling on the importance of adherence to annual lung cancer LDCT screening, impact of co-morbidities, and ability or willingness to undergo diagnosis and treatment is imperative for compliance of the program.   Counseling on the importance of continued smoking cessation for former smokers; the importance  of smoking cessation for current smokers, and information about tobacco cessation interventions have been given to patient including [place] and 1800 quit [place] programs.   Written order for lung cancer screening with LDCT has been given to the patient and any and all questions have been answered to the best of my abilities.    Yearly follow up will be coordinated by Burgess Estelle, Thoracic Navigator.  I provided 15 minutes of face-to-face video visit time during this encounter, and > 50% was spent counseling as documented under my assessment & plan.   Jacquelin Hawking, NP

## 2021-03-03 ENCOUNTER — Encounter: Payer: Self-pay | Admitting: *Deleted

## 2021-03-04 ENCOUNTER — Telehealth: Payer: Self-pay | Admitting: *Deleted

## 2021-03-04 DIAGNOSIS — I358 Other nonrheumatic aortic valve disorders: Secondary | ICD-10-CM

## 2021-03-04 DIAGNOSIS — J439 Emphysema, unspecified: Secondary | ICD-10-CM

## 2021-03-04 NOTE — Telephone Encounter (Signed)
Notified patient of LDCT lung cancer screening program results with recommendation for 12 month follow up imaging. Also notified of incidental findings noted below and is encouraged to discuss further with PCP who will receive a copy of this note and/or the CT report. Patient verbalizes understanding.   IMPRESSION: 1. Lung-RADS 2, benign appearance or behavior. Continue annual screening with low-dose chest CT without contrast in 12 months. 2.  Emphysema (ICD10-J43.9) and Aortic Atherosclerosis (ICD10-170.0)

## 2021-03-04 NOTE — Telephone Encounter (Signed)
2.  Emphysema (ICD10-J43.9) and Aortic Atherosclerosis (ICD10-170.0) This was the reason for refferals that were recommended.

## 2021-03-04 NOTE — Telephone Encounter (Signed)
Emphysema on lung scan was seen recommend a consult with pulmonary MD if she does not have one.   Aortic plaque sclerosis also seen on lung scan recommend consult with cardiology if she does not have one.   Also needs repeat low dose CT scan yearly.   Ok to place referrals as above to location of choice by patient.

## 2021-03-04 NOTE — Telephone Encounter (Signed)
lmtcb okay for [facility] triage to advise, will place in order for referral.KW

## 2021-03-04 NOTE — Addendum Note (Signed)
Addended by: Minette Headland on: 03/04/2021 10:38 AM   Modules accepted: Orders

## 2021-03-04 NOTE — Telephone Encounter (Signed)
Pt wanted to know why she needed a referral, please advise.

## 2021-03-04 NOTE — Telephone Encounter (Signed)
I advised patient again of results she said seeing specialist is not needed for these problems. KW

## 2021-03-15 ENCOUNTER — Encounter: Payer: Self-pay | Admitting: Adult Health

## 2021-03-16 DIAGNOSIS — L820 Inflamed seborrheic keratosis: Secondary | ICD-10-CM | POA: Diagnosis not present

## 2021-03-16 DIAGNOSIS — Z85828 Personal history of other malignant neoplasm of skin: Secondary | ICD-10-CM | POA: Diagnosis not present

## 2021-03-16 DIAGNOSIS — X32XXXA Exposure to sunlight, initial encounter: Secondary | ICD-10-CM | POA: Diagnosis not present

## 2021-03-16 DIAGNOSIS — D2262 Melanocytic nevi of left upper limb, including shoulder: Secondary | ICD-10-CM | POA: Diagnosis not present

## 2021-03-16 DIAGNOSIS — D2261 Melanocytic nevi of right upper limb, including shoulder: Secondary | ICD-10-CM | POA: Diagnosis not present

## 2021-03-16 DIAGNOSIS — L538 Other specified erythematous conditions: Secondary | ICD-10-CM | POA: Diagnosis not present

## 2021-03-16 DIAGNOSIS — L570 Actinic keratosis: Secondary | ICD-10-CM | POA: Diagnosis not present

## 2021-03-16 DIAGNOSIS — D2272 Melanocytic nevi of left lower limb, including hip: Secondary | ICD-10-CM | POA: Diagnosis not present

## 2021-10-24 ENCOUNTER — Encounter: Payer: Self-pay | Admitting: Adult Health

## 2021-12-26 ENCOUNTER — Other Ambulatory Visit: Payer: Self-pay

## 2021-12-26 ENCOUNTER — Encounter: Payer: Self-pay | Admitting: Physician Assistant

## 2021-12-26 ENCOUNTER — Ambulatory Visit (INDEPENDENT_AMBULATORY_CARE_PROVIDER_SITE_OTHER): Payer: Medicare PPO | Admitting: Physician Assistant

## 2021-12-26 VITALS — BP 132/85 | HR 85 | Temp 97.9°F | Ht 65.0 in | Wt 114.6 lb

## 2021-12-26 DIAGNOSIS — Z0000 Encounter for general adult medical examination without abnormal findings: Secondary | ICD-10-CM

## 2021-12-26 DIAGNOSIS — Z1231 Encounter for screening mammogram for malignant neoplasm of breast: Secondary | ICD-10-CM

## 2021-12-26 DIAGNOSIS — Z1159 Encounter for screening for other viral diseases: Secondary | ICD-10-CM | POA: Diagnosis not present

## 2021-12-26 DIAGNOSIS — F32A Depression, unspecified: Secondary | ICD-10-CM | POA: Diagnosis not present

## 2021-12-26 DIAGNOSIS — I10 Essential (primary) hypertension: Secondary | ICD-10-CM | POA: Insufficient documentation

## 2021-12-26 DIAGNOSIS — R030 Elevated blood-pressure reading, without diagnosis of hypertension: Secondary | ICD-10-CM

## 2021-12-26 MED ORDER — BUPROPION HCL ER (XL) 300 MG PO TB24: 300.0000 mg | ORAL_TABLET | Freq: Every day | ORAL | 3 refills | Status: DC

## 2021-12-26 NOTE — Progress Notes (Signed)
I,Sha'taria Tyson,acting as a Education administrator for [employer], PA-C.,have documented all relevant documentation on the behalf of Mikey Kirschner, PA-C,as directed by  Mikey Kirschner, PA-C while in the presence of Mikey Kirschner, PA-C.  Annual Wellness Visit     Patient: Sandra Franco, Female    DOB: Sep 01, 1951, 71 y.o.   MRN: [id number] Visit Date: 12/26/2021  Today's Provider: Mikey Kirschner, PA-C   Cc. CPE  Subjective    Sandra Franco is a 71 y.o. female who presents today for her Annual Wellness Visit. She reports consuming a general and well balanced  diet. The patient does not participate in regular exercise at present. She generally feels well. She reports sleeping fairly well. She does not have additional problems to discuss today.   Medications: Outpatient Medications Prior to Visit  Medication Sig   Calcium Citrate-Vitamin D (CALCIUM + D PO) Take 1 capsule by mouth daily.   ibuprofen (ADVIL) 200 MG tablet Take 200 mg by mouth every 6 (six) hours as needed.   Multiple Vitamin (MULTIVITAMIN) capsule Take 1 capsule by mouth daily.   [DISCONTINUED] buPROPion (WELLBUTRIN XL) 300 MG 24 hr tablet Take 1 tablet (300 mg total) by mouth daily.   No facility-administered medications prior to visit.    Allergies  Allergen Reactions   Amoxicillin Rash   Cefdinir Rash   Codeine Nausea Only    Patient Care Team: Mikey Kirschner, PA-C as PCP - [facility], [place]  Review of Systems  Constitutional: Negative.  Negative for fatigue and fever.  Eyes: Negative.   Respiratory:  Negative for shortness of breath.   Cardiovascular: Negative.  Negative for leg swelling.  Gastrointestinal: Negative.   Endocrine: Negative.   Genitourinary: Negative.   Musculoskeletal: Negative.   Skin: Negative.   Allergic/Immunologic: Negative.   Neurological: Negative.  Negative for dizziness and headaches.  Hematological: Negative.    Psychiatric/Behavioral: Negative.    All other systems reviewed and are negative.      Objective    Blood pressure 132/85, pulse 85, temperature 97.9 F (36.6 C), temperature source Oral, height 5\' 5"  (1.651 m), weight 114 lb 9.6 oz (52 kg), SpO2 100 %.   Physical Exam Constitutional:      General: She is awake.     Appearance: She is well-developed.  HENT:     Head: Normocephalic.     Right Ear: Tympanic membrane normal.     Left Ear: Tympanic membrane normal.  Eyes:     Conjunctiva/sclera: Conjunctivae normal.     Pupils: Pupils are equal, round, and reactive to light.  Neck:     Thyroid: No thyroid mass or thyromegaly.  Cardiovascular:     Rate and Rhythm: Normal rate and regular rhythm.     Heart sounds: Normal heart sounds.  Pulmonary:     Effort: Pulmonary effort is normal.     Breath sounds: Normal breath sounds.  Abdominal:     Palpations: Abdomen is soft.     Tenderness: There is no abdominal tenderness.  Musculoskeletal:     Right lower leg: No swelling.     Left lower leg: No swelling.  Lymphadenopathy:     Cervical: No cervical adenopathy.  Skin:    General: Skin is warm.  Neurological:     Mental Status: She is alert and oriented to person, place, and time.  Psychiatric:        Attention and Perception: Attention normal.  Mood and Affect: Mood normal.        Speech: Speech normal.        Behavior: Behavior is cooperative.   Most recent functional status assessment: In your present state of health, do you have any difficulty performing the following activities: 12/26/2021  Hearing? N  Vision? N  Comment wears glasses  Difficulty concentrating or making decisions? N  Walking or climbing stairs? N  Dressing or bathing? N  Doing errands, shopping? N  Preparing Food and eating ? -  Using the Toilet? -  In the past six months, have you accidently leaked urine? -  Do you have problems with loss of bowel control? -  Managing your Medications? -   Managing your Finances? -  Housekeeping or managing your Housekeeping? -  Some recent data might be hidden   Most recent fall risk assessment: Fall Risk  12/26/2021  Falls in the past year? 0  Number falls in past yr: 0  Injury with Fall? 0  Risk for fall due to : No Fall Risks    Most recent depression screenings: PHQ 2/9 Scores 12/26/2021 12/24/2020  PHQ - 2 Score 0 1  PHQ- 9 Score 2 2   Most recent cognitive screening: 6CIT Screen 01/26/2021  What Year? 0 points  What month? 0 points  What time? 0 points  Count back from 20 0 points  Months in reverse 0 points  Repeat phrase 0 points  Total Score 0   Most recent Audit-C alcohol use screening Alcohol Use Disorder Test (AUDIT) 12/26/2021  1. How often do you have a drink containing alcohol? 3  2. How many drinks containing alcohol do you have on a typical day when you are drinking? 1  3. How often do you have six or more drinks on one occasion? 0  AUDIT-C Score 4  4. How often during the last year have you found that you were not able to stop drinking once you had started? -  5. How often during the last year have you failed to do what was normally expected from you because of drinking? -  6. How often during the last year have you needed a first drink in the morning to get yourself going after a heavy drinking session? -  7. How often during the last year have you had a feeling of guilt of remorse after drinking? -  8. How often during the last year have you been unable to remember what happened the night before because you had been drinking? -  9. Have you or someone else been injured as a result of your drinking? -  10. Has a relative or friend or a doctor or another health worker been concerned about your drinking or suggested you cut down? -  Alcohol Use Disorder Identification Test Final Score (AUDIT) -  Alcohol Brief Interventions/Follow-up -   A score of 3 or more in women, and 4 or more in men indicates increased risk  for alcohol abuse, EXCEPT if all of the points are from question 1   No results found for any visits on 12/26/21.  Assessment & Plan     Annual wellness visit done today including the all of the following: Reviewed patient's Family Medical History Reviewed and updated list of patient's medical providers Assessment of cognitive impairment was done Assessed patient's functional ability Established a written schedule for health screening [place] Completed and Reviewed  Exercise Activities and Dietary recommendations  Goals  Quit Smoking     Recommend to continue efforts to reduce smoking habits until no longer smoking.         Immunization History  Administered Date(s) Administered   Fluad Quad(high Dose 65+) 08/21/2019   Influenza, High Dose Seasonal PF 09/08/2018, 08/27/2020   Influenza-Unspecified 08/24/2017, 09/08/2018, 08/21/2019   PFIZER(Purple Top)SARS-COV-2 Vaccination 01/19/2020, 02/09/2020, 09/06/2020   Pneumococcal Polysaccharide-23 09/12/2013   Tdap 01/27/2011    Health Maintenance  Topic Date Due   Hepatitis C Screening  Never done   COVID-19 Vaccine (4 - Booster for Pfizer series) 01/11/2022 (Originally 11/01/2020)   TETANUS/TDAP  01/26/2022 (Originally 01/26/2021)   Zoster Vaccines- Shingrix (1 of 2) 03/26/2022 (Originally 08/02/1999)   Pneumonia Vaccine 7+ Years old (2 - PCV) 12/26/2022 (Originally 09/12/2014)   MAMMOGRAM  01/19/2022   COLONOSCOPY (Pts 45-65yrs Insurance coverage will need to be confirmed)  08/10/2023   INFLUENZA VACCINE  Completed   DEXA SCAN  Completed   HPV VACCINES  Aged Out     Discussed health benefits of physical activity, and encouraged her to engage in regular exercise appropriate for her age and condition.    Problem List Items Addressed This Visit       Other   Depression   Relevant Medications   buPROPion (WELLBUTRIN XL) 300 MG 24 hr tablet   Elevated blood-pressure reading without diagnosis of  hypertension    Repeat lower 132/85 advised her to check at home and send a mychart message w/ readings, as she would like to stay out of the office.       Other Visit Diagnoses     Encounter for physical examination    -  Primary   Relevant Orders   MM 3D SCREEN BREAST BILATERAL   Comprehensive Metabolic Panel (CMET)   CBC w/Diff/Platelet   Lipid Profile   HgB A1c   TSH + free T4   Encounter for screening mammogram for breast cancer       Relevant Orders   MM 3D SCREEN BREAST BILATERAL   Encounter for hepatitis C screening test for low risk patient       Relevant Orders   Hepatitis C antibody        Return in about 1 year (around 12/26/2022) for CPE.     I, Mikey Kirschner, PA-C have reviewed all documentation for this visit. The documentation on  12/26/2021 for the exam, diagnosis, procedures, and orders are all accurate and complete.    Mikey Kirschner, PA-C  [facility] [phone] (phone) [phone] (fax)  [place]

## 2021-12-26 NOTE — Assessment & Plan Note (Signed)
Repeat lower 132/85 advised her to check at home and send a mychart message w/ readings, as she would like to stay out of the office.

## 2021-12-27 LAB — CBC WITH DIFFERENTIAL/PLATELET
Basophils Absolute: 0 10*3/uL (ref 0.0–0.2)
Basos: 1 %
EOS (ABSOLUTE): 0.7 10*3/uL — ABNORMAL HIGH (ref 0.0–0.4)
Eos: 11 %
Hematocrit: 43.9 % (ref 34.0–46.6)
Hemoglobin: 15 g/dL (ref 11.1–15.9)
Immature Grans (Abs): 0 10*3/uL (ref 0.0–0.1)
Immature Granulocytes: 0 %
Lymphocytes Absolute: 2.1 10*3/uL (ref 0.7–3.1)
Lymphs: 32 %
MCH: 30.5 pg (ref 26.6–33.0)
MCHC: 34.2 g/dL (ref 31.5–35.7)
MCV: 89 fL (ref 79–97)
Monocytes Absolute: 0.4 10*3/uL (ref 0.1–0.9)
Monocytes: 7 %
Neutrophils Absolute: 3.3 10*3/uL (ref 1.4–7.0)
Neutrophils: 49 %
Platelets: 146 10*3/uL — ABNORMAL LOW (ref 150–450)
RBC: 4.91 x10E6/uL (ref 3.77–5.28)
RDW: 13.2 % (ref 11.7–15.4)
WBC: 6.6 10*3/uL (ref 3.4–10.8)

## 2021-12-27 LAB — COMPREHENSIVE METABOLIC PANEL
ALT: 22 IU/L (ref 0–32)
AST: 26 IU/L (ref 0–40)
Albumin/Globulin Ratio: 1.8 (ref 1.2–2.2)
Albumin: 4.7 g/dL (ref 3.7–4.7)
Alkaline Phosphatase: 86 IU/L (ref 44–121)
BUN/Creatinine Ratio: 13 (ref 12–28)
BUN: 11 mg/dL (ref 8–27)
Bilirubin Total: 0.5 mg/dL (ref 0.0–1.2)
CO2: 26 mmol/L (ref 20–29)
Calcium: 10.4 mg/dL — ABNORMAL HIGH (ref 8.7–10.3)
Chloride: 100 mmol/L (ref 96–106)
Creatinine, Ser: 0.82 mg/dL (ref 0.57–1.00)
Globulin, Total: 2.6 g/dL (ref 1.5–4.5)
Glucose: 84 mg/dL (ref 70–99)
Potassium: 4.4 mmol/L (ref 3.5–5.2)
Sodium: 139 mmol/L (ref 134–144)
Total Protein: 7.3 g/dL (ref 6.0–8.5)
eGFR: 76 mL/min/{1.73_m2} (ref >59)

## 2021-12-27 LAB — LIPID PANEL
Chol/HDL Ratio: 2.5 ratio (ref 0.0–4.4)
Cholesterol, Total: 218 mg/dL — ABNORMAL HIGH (ref 100–199)
HDL: 88 mg/dL (ref >39)
LDL Chol Calc (NIH): 116 mg/dL — ABNORMAL HIGH (ref 0–99)
Triglycerides: 80 mg/dL (ref 0–149)
VLDL Cholesterol Cal: 14 mg/dL (ref 5–40)

## 2021-12-27 LAB — HEMOGLOBIN A1C
Est. average glucose Bld gHb Est-mCnc: 103 mg/dL
Hgb A1c MFr Bld: 5.2 % (ref 4.8–5.6)

## 2021-12-27 LAB — TSH+FREE T4
Free T4: 1.18 ng/dL (ref 0.82–1.77)
TSH: 1.29 u[IU]/mL (ref 0.450–4.500)

## 2021-12-27 LAB — HEPATITIS C ANTIBODY: Hep C Virus Ab: <0.1 s/co ratio (ref 0.0–0.9)

## 2022-01-03 ENCOUNTER — Encounter: Payer: Self-pay | Admitting: Physician Assistant

## 2022-01-23 ENCOUNTER — Encounter: Payer: Self-pay | Admitting: Physician Assistant

## 2022-02-08 ENCOUNTER — Ambulatory Visit
Admission: RE | Admit: 2022-02-08 | Discharge: 2022-02-08 | Disposition: A | Discharge status: Home / Self Care | Payer: Medicare PPO | Source: Ambulatory Visit | Attending: Physician Assistant | Admitting: Physician Assistant

## 2022-02-08 ENCOUNTER — Other Ambulatory Visit: Payer: Self-pay

## 2022-02-08 DIAGNOSIS — Z1231 Encounter for screening mammogram for malignant neoplasm of breast: Secondary | ICD-10-CM | POA: Insufficient documentation

## 2022-02-13 IMAGING — MG MM DIGITAL SCREENING BILAT W/ TOMO AND CAD
6 of 10 series · 6 of 30 positions shown · non-contrast
Comparison: Previous exam(s).

CLINICAL DATA: Screening.

EXAM:
DIGITAL SCREENING BILATERAL MAMMOGRAM WITH TOMOSYNTHESIS AND CAD
TECHNIQUE: Bilateral screening digital craniocaudal and mediolateral oblique
mammograms were obtained. Bilateral screening digital breast
tomosynthesis was performed. The images were evaluated with
computer-aided detection.

[L CC synth-2D (1 of 2)]
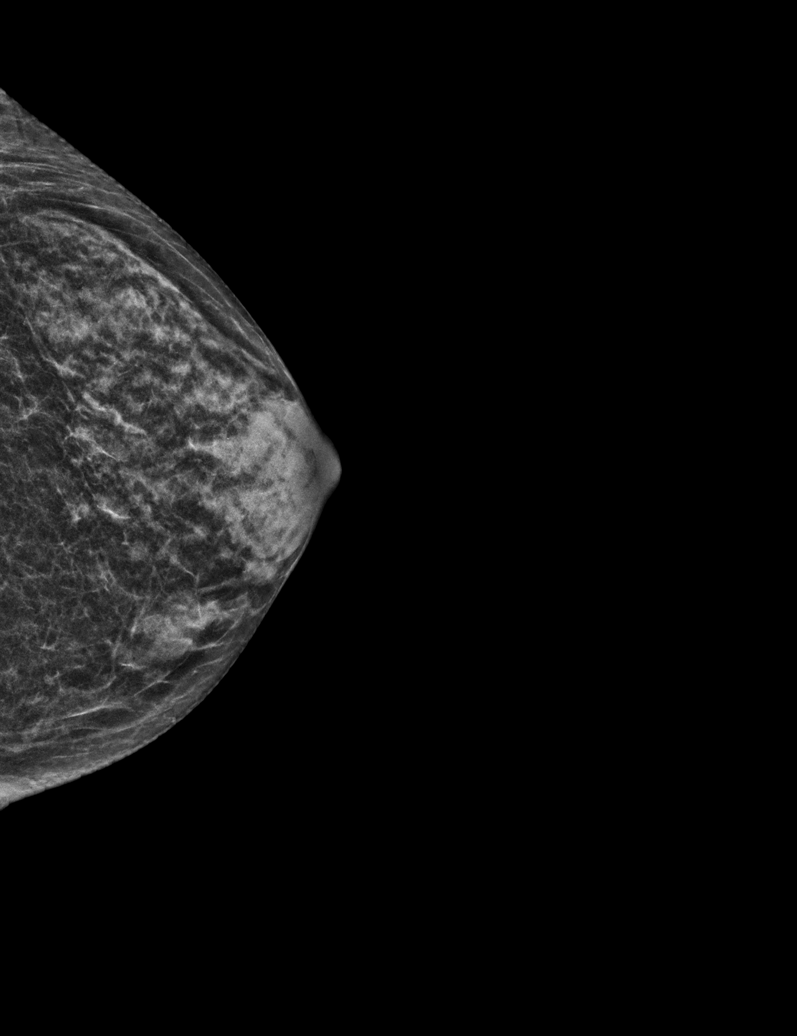

[L MLO synth-2D]
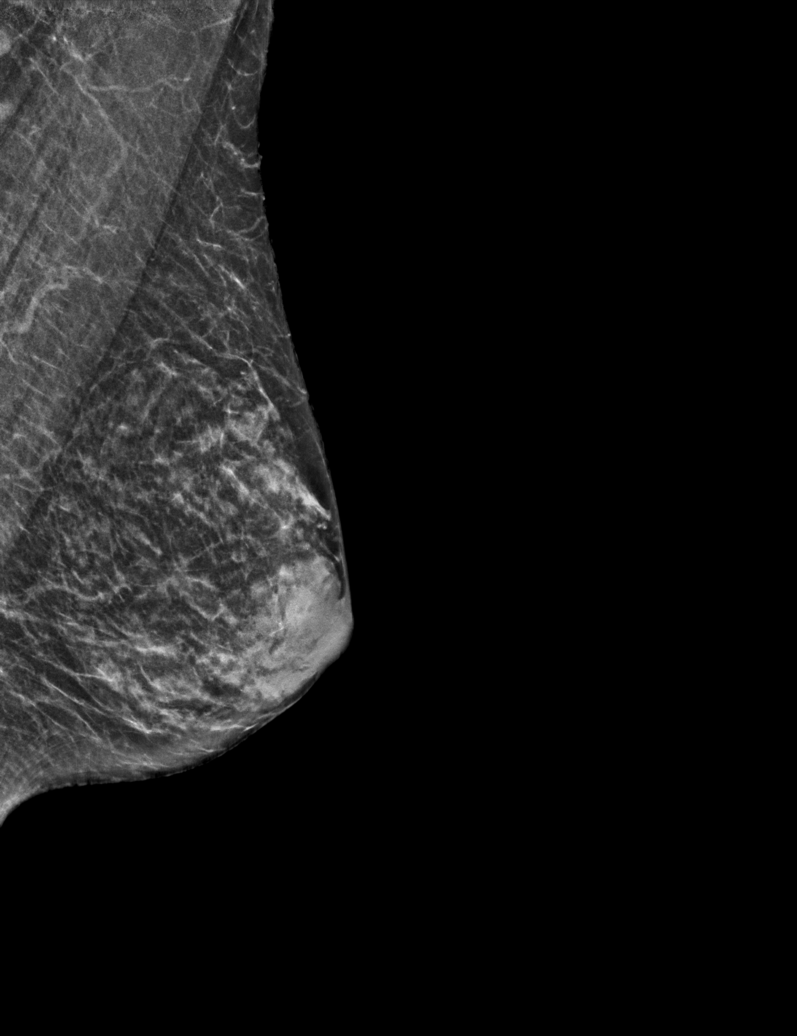

[R MLO synth-2D]
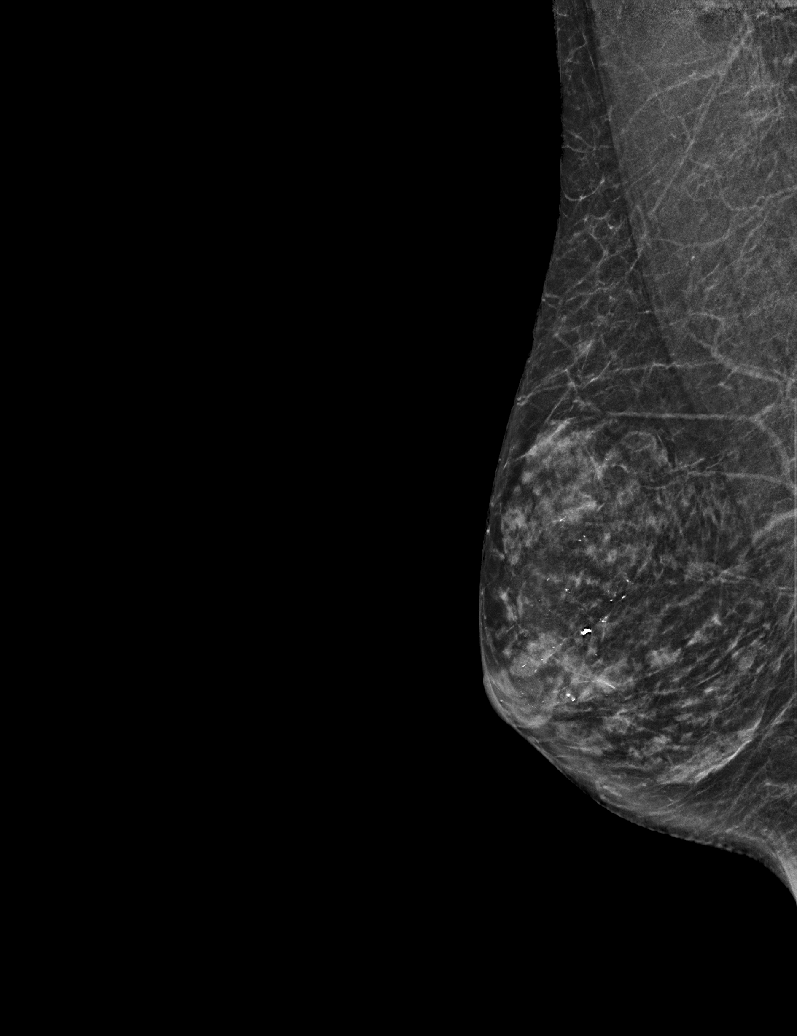

[L CC synth-2D (2 of 2)]
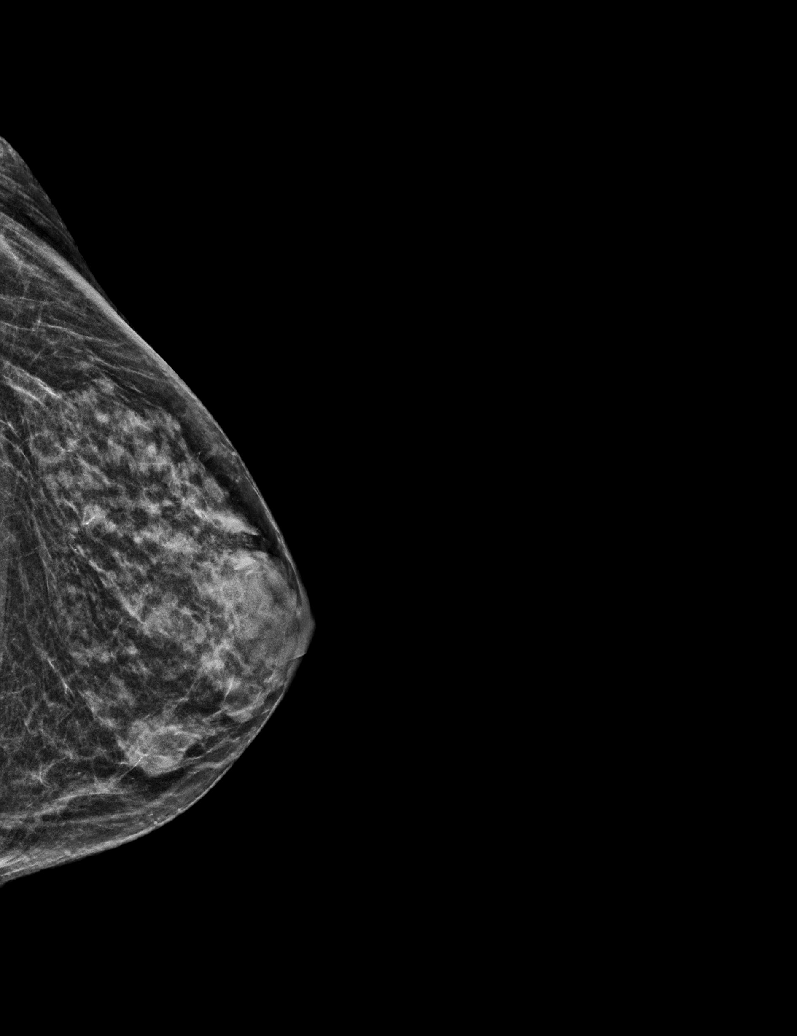

[R CC synth-2D]
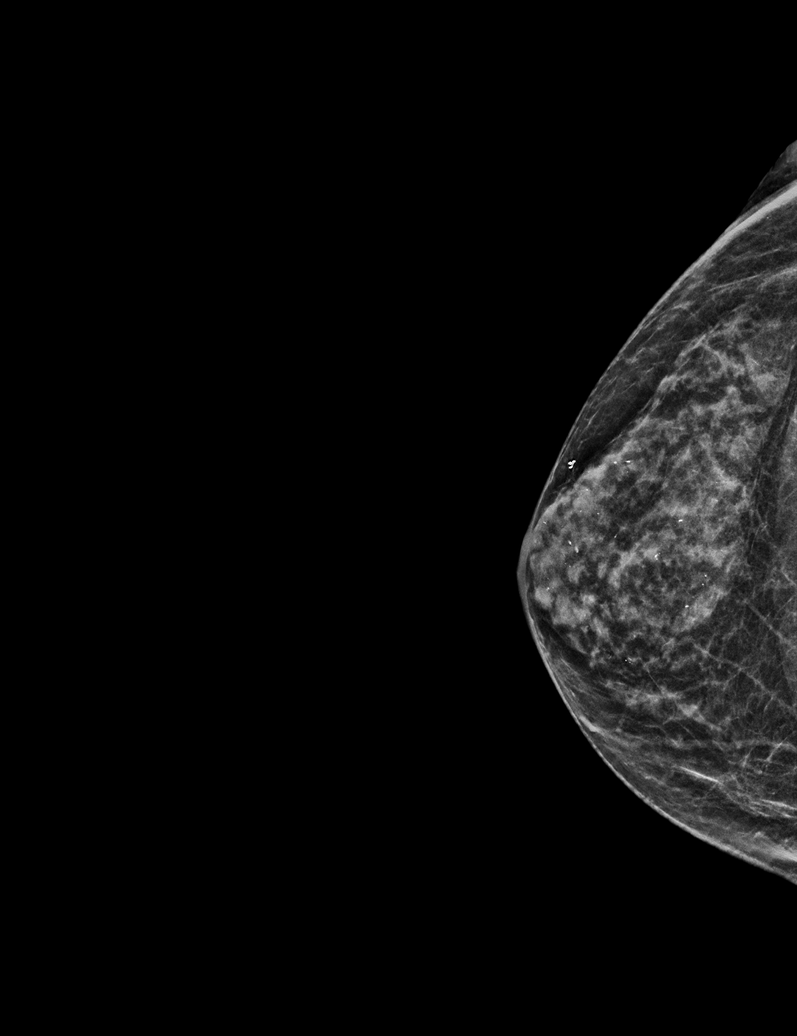

[R CC tomo · tomo slice 19/37.0]
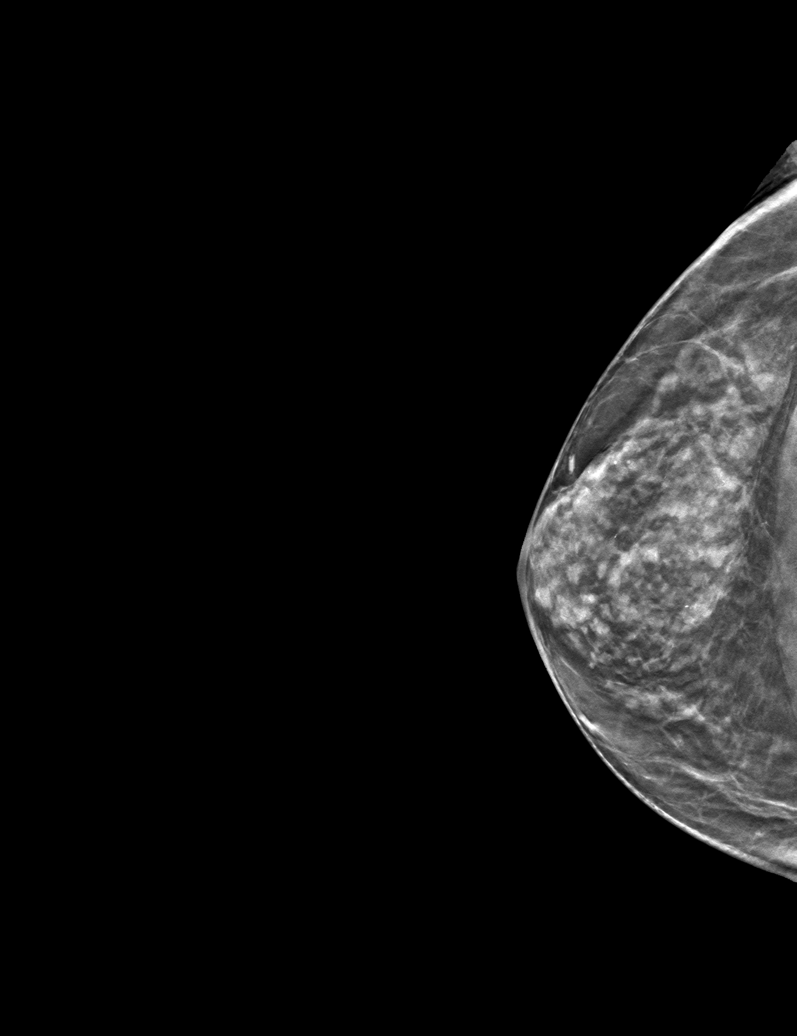

[6 of 30 positions shown; findings below may reference images not displayed]

ACR Breast Density Category c: The breast tissue is heterogeneously
dense, which may obscure small masses.
FINDINGS: There are no findings suspicious for malignancy.
IMPRESSION: No mammographic evidence of malignancy. A result letter of this
screening mammogram will be mailed directly to the patient.

RECOMMENDATION:
Screening mammogram in one year. (Code:[id number])

BI-RADS CATEGORY  1: Negative.

## 2022-05-01 DIAGNOSIS — Z85828 Personal history of other malignant neoplasm of skin: Secondary | ICD-10-CM | POA: Diagnosis not present

## 2022-05-01 DIAGNOSIS — C44719 Basal cell carcinoma of skin of left lower limb, including hip: Secondary | ICD-10-CM | POA: Diagnosis not present

## 2022-05-01 DIAGNOSIS — D2262 Melanocytic nevi of left upper limb, including shoulder: Secondary | ICD-10-CM | POA: Diagnosis not present

## 2022-05-01 DIAGNOSIS — L821 Other seborrheic keratosis: Secondary | ICD-10-CM | POA: Diagnosis not present

## 2022-05-01 DIAGNOSIS — X32XXXA Exposure to sunlight, initial encounter: Secondary | ICD-10-CM | POA: Diagnosis not present

## 2022-05-01 DIAGNOSIS — L570 Actinic keratosis: Secondary | ICD-10-CM | POA: Diagnosis not present

## 2022-05-01 DIAGNOSIS — D225 Melanocytic nevi of trunk: Secondary | ICD-10-CM | POA: Diagnosis not present

## 2022-05-01 DIAGNOSIS — D2261 Melanocytic nevi of right upper limb, including shoulder: Secondary | ICD-10-CM | POA: Diagnosis not present

## 2022-05-01 DIAGNOSIS — D0439 Carcinoma in situ of skin of other parts of face: Secondary | ICD-10-CM | POA: Diagnosis not present

## 2022-05-01 DIAGNOSIS — D485 Neoplasm of uncertain behavior of skin: Secondary | ICD-10-CM | POA: Diagnosis not present

## 2022-05-06 ENCOUNTER — Telehealth: Payer: Self-pay | Admitting: Acute Care

## 2022-05-06 NOTE — Telephone Encounter (Signed)
Attempted to reach pt to schedule annual LDCT-LVMM 

## 2022-07-11 DIAGNOSIS — D0439 Carcinoma in situ of skin of other parts of face: Secondary | ICD-10-CM | POA: Diagnosis not present

## 2022-07-18 DIAGNOSIS — C44719 Basal cell carcinoma of skin of left lower limb, including hip: Secondary | ICD-10-CM | POA: Diagnosis not present

## 2022-08-11 DIAGNOSIS — Z936 Other artificial openings of urinary tract status: Secondary | ICD-10-CM | POA: Diagnosis not present

## 2022-11-02 DIAGNOSIS — B078 Other viral warts: Secondary | ICD-10-CM | POA: Diagnosis not present

## 2022-11-02 DIAGNOSIS — X32XXXA Exposure to sunlight, initial encounter: Secondary | ICD-10-CM | POA: Diagnosis not present

## 2022-11-02 DIAGNOSIS — D2271 Melanocytic nevi of right lower limb, including hip: Secondary | ICD-10-CM | POA: Diagnosis not present

## 2022-11-02 DIAGNOSIS — D485 Neoplasm of uncertain behavior of skin: Secondary | ICD-10-CM | POA: Diagnosis not present

## 2022-11-02 DIAGNOSIS — D2262 Melanocytic nevi of left upper limb, including shoulder: Secondary | ICD-10-CM | POA: Diagnosis not present

## 2022-11-02 DIAGNOSIS — R208 Other disturbances of skin sensation: Secondary | ICD-10-CM | POA: Diagnosis not present

## 2022-11-02 DIAGNOSIS — D2261 Melanocytic nevi of right upper limb, including shoulder: Secondary | ICD-10-CM | POA: Diagnosis not present

## 2022-11-02 DIAGNOSIS — L570 Actinic keratosis: Secondary | ICD-10-CM | POA: Diagnosis not present

## 2022-11-02 DIAGNOSIS — Z85828 Personal history of other malignant neoplasm of skin: Secondary | ICD-10-CM | POA: Diagnosis not present

## 2022-11-23 ENCOUNTER — Ambulatory Visit: Payer: Self-pay | Admitting: *Deleted

## 2022-11-23 ENCOUNTER — Other Ambulatory Visit: Payer: Self-pay | Admitting: Family Medicine

## 2022-11-23 DIAGNOSIS — U071 COVID-19: Secondary | ICD-10-CM | POA: Insufficient documentation

## 2022-11-23 MED ORDER — NIRMATRELVIR/RITONAVIR (PAXLOVID)TABLET: 3.0000 | ORAL_TABLET | Freq: Two times a day (BID) | ORAL | 0 refills | Status: AC

## 2022-11-23 NOTE — Telephone Encounter (Addendum)
Summary: headache, nasal congestion, cough, chills, legs ache   Pt stated her husband tested positive for COVID on Christmas day. Stated she has started with symptoms of headache, nasal congestion, cough, chills, and her legs ache. Pt is requesting that the medication Molnupiravir be called in for her.   Sending HP due to pt sounding in distress. Pt seeking clinical advice.       Chief Complaint: Covid exposure Symptoms: Sore throat, subjective fever, body aches, dry cough Frequency: yesterday afternoon Pertinent Negatives: Patient denies SOB Disposition: '[]'$ ED /'[]'$ Urgent Care (no appt availability in office) / '[]'$ Appointment(In office/virtual)/ '[]'$  [facility] [facility]/ '[]'$ Home Care/ '[]'$ Refused Recommended Disposition /'[]'$ [place] [facility]/ '[x]'$  Follow-up with PCP Additional Notes: Pt's husband positive covid Monday, same symptoms. Requesting oral anti viral, advised requires positive test, will home test now, NT will CB for results.   0915: Called pt back, states she will have to re-do test. "I don't think I did it right." Advised pt to CB with results.  Pt positive. ' Addressed in another encounter. Answer Assessment - Initial Assessment Questions 1. COVID-19 EXPOSURE: "Please describe how you were exposed to someone with a COVID-19 infection."     Husband in household 2. PLACE of CONTACT: "Where were you when you were exposed to COVID-19?" (e.g., home, school, medical waiting room; which city?)     Home 3. TYPE of CONTACT: "How much contact was there?" (e.g., sitting next to, live in same house, work in same office, same building)     Home 4. DURATION of CONTACT: "How long were you in contact with the COVID-19 patient?" (e.g., a few seconds, passed by person, a few minutes, 15 minutes or longer, live with the patient)     Home 5. MASK: "Were you wearing a mask?" "Was the other person wearing a mask?" Note: wearing a mask reduces the risk of an otherwise close contact.     no 6.  DATE of CONTACT: "When did you have contact with a COVID-19 patient?" (e.g., how many days ago)     Husband tested positive Monday 8. SYMPTOMS: "Do you have any symptoms?" (e.g., fever, cough, breathing difficulty, loss of taste or smell)     Cough, mostly dry, Subjective hot then chills,body aches, sore throat, 9. VACCINE: "Have you gotten the COVID-19 vaccine?" If Yes, ask: "Which one, how many shots, when did you get it?"     Initially, no booster 11. HIGH RISK: "Do you have any heart or lung problems?" (e.g., asthma, COPD, heart failure) "Do you have a weak immune system or other risk factors?" (e.g., HIV positive, chemotherapy, renal failure, diabetes mellitus, sickle cell anemia, obesity)  Protocols used: Coronavirus (COVID-19) Exposure-A-AH

## 2022-11-23 NOTE — Telephone Encounter (Signed)
Patient advised. Verbalized understanding 

## 2022-11-23 NOTE — Telephone Encounter (Signed)
Reason for Disposition  [1] Caller has URGENT medicine question about med that PCP or specialist prescribed AND [2] triager unable to answer question    Requesting antiviral for Covid.  Answer Assessment - Initial Assessment Questions 1. NAME of MEDICINE: "What medicine(s) are you calling about?"     Positive Covid.   I'm having the same symptoms as my husband who also is positive with Covid.    2. QUESTION: "What is your question?" (e.g., double dose of medicine, side effect)     I want to be put on  Molnupiravir 200 mg.   That's what they put my husband on and he is doing much better.  Symptoms:   Headache, fever, nasal congestion, cough, sore throat, legs aching, chills, cold sweats.    No chest tightness or shortness of breath.  Started late yesterday afternoon (Wed)  3. PRESCRIBER: "Who prescribed the medicine?" Reason: if prescribed by specialist, call should be referred to that group.     Asking Mikey Kirschner, PA-C if she would be willing to call this in for her.  She doesn't feel like trying to come in. 4. SYMPTOMS: "Do you have any symptoms?" If Yes, ask: "What symptoms are you having?"  "How bad are the symptoms (e.g., mild, moderate, severe)     See above 5. PREGNANCY:  "Is there any chance that you are pregnant?" "When was your last menstrual period?"     N/A due to age  Protocols used: Medication Question Call-A-AH

## 2022-11-23 NOTE — Telephone Encounter (Signed)
  Chief Complaint: Covid positive Symptoms: Headache, fever, nasal congestion, cough, sore throat, legs aching, chills, cold sweats. Frequency: Symptoms started late Wed. afternoon Pertinent Negatives: Patient denies shortness of breath or chest tightness Disposition: '[]'$ ED /'[]'$ Urgent Care (no appt availability in office) / '[]'$ Appointment(In office/virtual)/ '[]'$  [place] [place] [facility]/ '[]'$ Home Care/ '[]'$ Refused Recommended Disposition /'[]'$ [place] [facility]/ '[x]'$  Follow-up with PCP Additional Notes: Her husband was diagnosed with Covid and given Molnupiravir 200 mg.   He is doing so much better.   She is requesting the same medication and that it be sent to the [facility] on [address].    She does feel like coming in.   Pt. Agreeable to someone calling her back.

## 2022-12-27 DIAGNOSIS — H2513 Age-related nuclear cataract, bilateral: Secondary | ICD-10-CM | POA: Diagnosis not present

## 2022-12-27 DIAGNOSIS — Z135 Encounter for screening for eye and ear disorders: Secondary | ICD-10-CM | POA: Diagnosis not present

## 2022-12-27 DIAGNOSIS — H524 Presbyopia: Secondary | ICD-10-CM | POA: Diagnosis not present

## 2022-12-28 ENCOUNTER — Ambulatory Visit (INDEPENDENT_AMBULATORY_CARE_PROVIDER_SITE_OTHER): Payer: Medicare PPO | Admitting: Physician Assistant

## 2022-12-28 ENCOUNTER — Encounter: Payer: Self-pay | Admitting: Physician Assistant

## 2022-12-28 VITALS — BP 136/83 | HR 87 | Ht 65.0 in | Wt 112.0 lb

## 2022-12-28 DIAGNOSIS — Z0000 Encounter for general adult medical examination without abnormal findings: Secondary | ICD-10-CM

## 2022-12-28 NOTE — Progress Notes (Unsigned)
Complete physical exam   Patient: Sandra Franco   DOB: 12/22/1950   72 y.o. Female  MRN: [id number] Visit Date: 12/28/2022  Today's healthcare provider: Mardene Speak, PA-C  CC: CPE  Subjective    Sandra Franco is a 72 y.o. female who presents today for a complete physical exam.  She reports consuming a general diet.  Does house work and gardening for excercise  She generally feels well. She reports sleeping fairly well. She does not have additional problems to discuss today.   CPE and annual wellness  Past Medical History:  Diagnosis Date   Depression    Heavy alcohol consumption    Mood disorder ([place])    Osteopenia    Tobacco use disorder    Tubular adenoma of colon 2019   needs another colonoscopy in 3 years   Past Surgical History:  Procedure Laterality Date   BREAST BIOPSY Right 2014   NEG   COLONOSCOPY  09/23/2012   negative   COLONOSCOPY WITH PROPOFOL N/A 08/09/2018   Procedure: COLONOSCOPY WITH PROPOFOL;  Surgeon: Manya Silvas, MD;  Location: [facility] ENDOSCOPY;  Service: Endoscopy;  Laterality: N/A;   FINGER FRACTURE SURGERY Right 1984   INDUCED ABORTION     TONSILLECTOMY     Social History   Socioeconomic History   Marital status: Married    Spouse name: Ruffin   Number of children: 1   Years of education: LPN   Highest education level: Some college, no degree  Occupational History   Occupation: retired Control and instrumentation engineer  Tobacco Use   Smoking status: Every Day    Packs/day: 0.50    Years: 51.00    Total pack years: 25.50    Types: Cigarettes   Smokeless tobacco: Never  Vaping Use   Vaping Use: Never used  Substance and Sexual Activity   Alcohol use: Yes    Alcohol/week: 14.0 - 21.0 standard drinks of alcohol    Types: 14 - 21 Cans of beer per week    Comment: 2-3   Drug use: No   Sexual activity: Not Currently    Birth control/protection: Post-menopausal  Other Topics Concern   Not on file  Social History Narrative   Not on file    Social Determinants of Health   Financial Resource Strain: Low Risk  (01/26/2021)   Overall Financial Resource Strain (CARDIA)    Difficulty of Paying Living Expenses: Not hard at all  Food Insecurity: No Food Insecurity (01/26/2021)   Hunger Vital Sign    Worried About Running Out of Food in the Last Year: Never true    Ran Out of Food in the Last Year: Never true  Transportation Needs: No Transportation Needs (01/26/2021)   PRAPARE - Hydrologist (Medical): No    Lack of Transportation (Non-Medical): No  Physical Activity: Inactive (01/26/2021)   Exercise Vital Sign    Days of Exercise per Week: 0 days    Minutes of Exercise per Session: 0 min  Stress: Stress Concern Present (01/26/2021)   [place]    Feeling of Stress : To some extent  Social Connections: Moderately Isolated (01/26/2021)   Social Connection and Isolation Panel [NHANES]    Frequency of Communication with Friends and Family: Twice a week    Frequency of Social Gatherings with Friends and Family: More than three times a week    Attends Religious Services: Never    Active Member  of Clubs or Organizations: No    Attends Archivist Meetings: Never    Marital Status: Married  Human resources officer Violence: Not At Risk (01/26/2021)   Humiliation, Afraid, Rape, and Kick questionnaire    Fear of Current or Ex-Partner: No    Emotionally Abused: No    Physically Abused: No    Sexually Abused: No   Family Status  Relation Name Status   Mother  Deceased   Father  Deceased   Mat Aunt  Deceased   Neg Hx  (Not Specified)   Family History  Problem Relation Age of Onset   Rheum arthritis Mother    Hypertension Mother    Lung cancer Father 59   Pancreatic cancer Maternal Aunt 53   Breast cancer Neg Hx    Allergies  Allergen Reactions   Amoxicillin Rash   Cefdinir Rash   Codeine Nausea Only    Patient Care Team: Mikey Kirschner, PA-C as PCP - [facility] [place]   Medications: Outpatient Medications Prior to Visit  Medication Sig   buPROPion (WELLBUTRIN XL) 300 MG 24 hr tablet Take 1 tablet (300 mg total) by mouth daily.   Calcium Citrate-Vitamin D (CALCIUM + D PO) Take 1 capsule by mouth daily.   ibuprofen (ADVIL) 200 MG tablet Take 200 mg by mouth every 6 (six) hours as needed.   Multiple Vitamin (MULTIVITAMIN) capsule Take 1 capsule by mouth daily.   No facility-administered medications prior to visit.    Review of Systems  HENT:  Positive for postnasal drip.   Allergic/Immunologic: Positive for environmental allergies.  Psychiatric/Behavioral:  Positive for sleep disturbance. The patient is nervous/anxious.       Objective    Ht '5\' 5"'$  (1.651 m)   BMI 19.07 kg/m     Physical Exam Vitals reviewed.  Constitutional:      General: She is not in acute distress.    Appearance: Normal appearance. She is well-developed. She is not diaphoretic.  HENT:     Head: Normocephalic and atraumatic.     Right Ear: Tympanic membrane, ear canal and external ear normal.     Left Ear: Tympanic membrane, ear canal and external ear normal.     Nose: Congestion (mild) and rhinorrhea (mild) present.     Mouth/Throat:     Mouth: Mucous membranes are moist.     Pharynx: Oropharynx is clear. No oropharyngeal exudate or posterior oropharyngeal erythema.  Eyes:     General: No scleral icterus.       Right eye: No discharge.        Left eye: No discharge.     Extraocular Movements: Extraocular movements intact.     Conjunctiva/sclera: Conjunctivae normal.     Pupils: Pupils are equal, round, and reactive to light.  Neck:     Thyroid: No thyromegaly.  Cardiovascular:     Rate and Rhythm: Normal rate and regular rhythm.     Pulses: Normal pulses.     Heart sounds: Normal heart sounds. No murmur heard. Pulmonary:     Effort: Pulmonary effort is normal. No  respiratory distress.     Breath sounds: Normal breath sounds. No wheezing or rales.  Abdominal:     General: Abdomen is flat. Bowel sounds are normal. There is no distension.     Palpations: Abdomen is soft. There is no mass.     Tenderness: There is no abdominal tenderness. There is guarding. There is no right CVA  tenderness, left CVA tenderness or rebound.     Hernia: No hernia is present.  Musculoskeletal:        General: No swelling, tenderness, deformity or signs of injury. Normal range of motion.     Cervical back: Normal range of motion and neck supple. No tenderness.     Right lower leg: No edema.     Left lower leg: No edema.  Lymphadenopathy:     Cervical: No cervical adenopathy.  Skin:    General: Skin is warm and dry.     Findings: No rash.  Neurological:     Mental Status: She is alert and oriented to person, place, and time. Mental status is at baseline.     Sensory: No sensory deficit.     Motor: No weakness.     Coordination: Coordination normal.     Gait: Gait normal.     Deep Tendon Reflexes: Reflexes normal.  Psychiatric:        Behavior: Behavior normal.        Thought Content: Thought content normal.        Judgment: Judgment normal.      Last depression screening scores    12/26/2021   10:29 AM 12/24/2020   11:26 AM  PHQ 2/9 Scores  PHQ - 2 Score 0 1  PHQ- 9 Score 2 2   Last fall risk screening    12/26/2021   10:29 AM  [place] in the past year? 0  Number falls in past yr: 0  Injury with Fall? 0  Risk for fall due to : No Fall Risks   Last Audit-C alcohol use screening    12/26/2021   10:29 AM  Alcohol Use Disorder Test (AUDIT)  1. How often do you have a drink containing alcohol? 3  2. How many drinks containing alcohol do you have on a typical day when you are drinking? 1  3. How often do you have six or more drinks on one occasion? 0  AUDIT-C Score 4   A score of 3 or more in women, and 4 or more in men indicates increased  risk for alcohol abuse, EXCEPT if all of the points are from question 1   No results found for any visits on 12/28/22.  Assessment & Plan    Routine Health Maintenance and Physical Exam   Annual wellness visit done today including the all of the following: Reviewed patient's Family Medical History Reviewed and updated list of patient's medical providers Assessment of cognitive impairment was done Assessed patient's functional ability Established a written schedule for health screening [place] Completed and Reviewed    Exercise Activities and Dietary recommendations  Goals      Quit Smoking     Recommend to continue efforts to reduce smoking habits until no longer smoking.         Immunization History  Administered Date(s) Administered   Fluad Quad(high Dose 65+) 08/21/2019   Influenza, High Dose Seasonal PF 09/08/2018, 08/27/2020   Influenza-Unspecified 08/24/2017, 09/08/2018, 08/21/2019   PFIZER(Purple Top)SARS-COV-2 Vaccination 01/19/2020, 02/09/2020, 09/06/2020   Pneumococcal Polysaccharide-23 09/12/2013   Tdap 01/27/2011    Health Maintenance  Topic Date Due   Zoster Vaccines- Shingrix (1 of 2) Never done   Pneumonia Vaccine 77+ Years old (2 - PCV) 09/12/2014   DTaP/Tdap/Td (2 - Td or Tdap) 01/26/2021   Medicare Annual Wellness (AWV)  01/26/2022   Lung Cancer Screening  03/02/2022   COVID-19 Vaccine (  4 - 2023-24 season) 07/28/2022   MAMMOGRAM  02/09/2023   COLONOSCOPY (Pts 45-42yr Insurance coverage will need to be confirmed)  08/10/2023   INFLUENZA VACCINE  Completed   DEXA SCAN  Completed   Hepatitis C Screening  Completed   HPV VACCINES  Aged Out    Discussed health benefits of physical activity, and encouraged her to engage in regular exercise appropriate for her age and condition.    UTD on dental/eye Things to do to keep yourself healthy  - Exercise at least 30-45 minutes a day, 3-4 days a week.  - Eat a low-fat diet with  lots of fruits and vegetables, up to 7-9 servings per day.  - Seatbelts can save your life. Wear them always.  - Smoke detectors on every level of your home, check batteries every year.  - Eye Doctor - have an eye exam every 1-2 years  - Safe sex - if you may be exposed to STDs, use a condom.  - Alcohol -  If you drink, do it moderately, less than 2 drinks per day.  - [place] Choose someone to speak for you if you are not able.  - Depression is common in our stressful world.If you're feeling down or losing interest in things you normally enjoy, please come in for a visit.  - Violence - If anyone is threatening or hurting you, please call immediately.  Pt [facility] promised to consider lung cancer screening Will reassess at her next FU visit. The patient was advised to call back or seek an in-person evaluation if the symptoms worsen or if the condition fails to improve as anticipated.  I discussed the assessment and treatment plan with the patient. The patient was provided an opportunity to ask questions and all were answered. The patient agreed with the plan and demonstrated an understanding of the instructions.  The entirety of the information documented in the History of Present Illness, Review of Systems and Physical Exam were personally obtained by me. Portions of this information were initially documented by the CMA and reviewed by me for thoroughness and accuracy.  JMardene Speak [facility] [place] [phone](phone) [phone](fax)

## 2023-01-09 ENCOUNTER — Other Ambulatory Visit: Payer: Self-pay | Admitting: Physician Assistant

## 2023-01-09 DIAGNOSIS — F32A Depression, unspecified: Secondary | ICD-10-CM

## 2023-01-09 MED ORDER — BUPROPION HCL ER (XL) 300 MG PO TB24: 300.0000 mg | ORAL_TABLET | Freq: Every day | ORAL | 0 refills | Status: DC

## 2023-01-25 ENCOUNTER — Other Ambulatory Visit: Payer: Self-pay | Admitting: Physician Assistant

## 2023-01-25 DIAGNOSIS — Z1231 Encounter for screening mammogram for malignant neoplasm of breast: Secondary | ICD-10-CM

## 2023-02-05 ENCOUNTER — Other Ambulatory Visit: Payer: Self-pay | Admitting: Physician Assistant

## 2023-02-05 ENCOUNTER — Telehealth: Payer: Self-pay

## 2023-02-05 DIAGNOSIS — Z122 Encounter for screening for malignant neoplasm of respiratory organs: Secondary | ICD-10-CM

## 2023-02-05 NOTE — Telephone Encounter (Unsigned)
Copied from [place] [id number]. Topic: General - Other >> Feb 05, 2023  2:43 PM Dominique A wrote: Reason for CRM: Pt states that she decided that she will get the lung screening and is wanting to know if her PCP need to make that appt for her or if she has to make the appt.  Please call pt back to let her know, per pt if she does not pick up, it is ok to leave a voice mail.

## 2023-02-07 NOTE — Telephone Encounter (Signed)
LVM for pt to update her CT/Lunch screening placed and referral coordinator will be in contact.

## 2023-02-08 ENCOUNTER — Other Ambulatory Visit: Payer: Self-pay

## 2023-02-08 DIAGNOSIS — F17210 Nicotine dependence, cigarettes, uncomplicated: Secondary | ICD-10-CM

## 2023-02-08 DIAGNOSIS — Z87891 Personal history of nicotine dependence: Secondary | ICD-10-CM

## 2023-02-14 ENCOUNTER — Ambulatory Visit
Admission: RE | Admit: 2023-02-14 | Discharge: 2023-02-14 | Disposition: A | Discharge status: Home / Self Care | Payer: Medicare PPO | Source: Ambulatory Visit | Attending: Physician Assistant | Admitting: Physician Assistant

## 2023-02-14 DIAGNOSIS — Z1231 Encounter for screening mammogram for malignant neoplasm of breast: Secondary | ICD-10-CM | POA: Diagnosis not present

## 2023-02-16 ENCOUNTER — Other Ambulatory Visit: Payer: Self-pay | Admitting: Physician Assistant

## 2023-02-16 DIAGNOSIS — R921 Mammographic calcification found on diagnostic imaging of breast: Secondary | ICD-10-CM

## 2023-02-16 DIAGNOSIS — R928 Other abnormal and inconclusive findings on diagnostic imaging of breast: Secondary | ICD-10-CM

## 2023-02-20 ENCOUNTER — Ambulatory Visit
Admission: RE | Admit: 2023-02-20 | Discharge: 2023-02-20 | Disposition: A | Discharge status: Home / Self Care | Payer: Medicare PPO | Source: Ambulatory Visit | Attending: Physician Assistant | Admitting: Physician Assistant

## 2023-02-20 DIAGNOSIS — Z1239 Encounter for other screening for malignant neoplasm of breast: Secondary | ICD-10-CM | POA: Diagnosis not present

## 2023-02-20 DIAGNOSIS — R921 Mammographic calcification found on diagnostic imaging of breast: Secondary | ICD-10-CM | POA: Insufficient documentation

## 2023-02-20 DIAGNOSIS — R928 Other abnormal and inconclusive findings on diagnostic imaging of breast: Secondary | ICD-10-CM | POA: Insufficient documentation

## 2023-02-20 DIAGNOSIS — F17210 Nicotine dependence, cigarettes, uncomplicated: Secondary | ICD-10-CM | POA: Diagnosis not present

## 2023-02-20 DIAGNOSIS — Z87891 Personal history of nicotine dependence: Secondary | ICD-10-CM | POA: Insufficient documentation

## 2023-02-21 ENCOUNTER — Other Ambulatory Visit: Payer: Self-pay | Admitting: Acute Care

## 2023-02-21 ENCOUNTER — Other Ambulatory Visit: Payer: Self-pay | Admitting: Physician Assistant

## 2023-02-21 DIAGNOSIS — F17210 Nicotine dependence, cigarettes, uncomplicated: Secondary | ICD-10-CM

## 2023-02-21 DIAGNOSIS — Z122 Encounter for screening for malignant neoplasm of respiratory organs: Secondary | ICD-10-CM

## 2023-02-21 DIAGNOSIS — Z87891 Personal history of nicotine dependence: Secondary | ICD-10-CM

## 2023-02-21 DIAGNOSIS — R928 Other abnormal and inconclusive findings on diagnostic imaging of breast: Secondary | ICD-10-CM

## 2023-02-21 DIAGNOSIS — R921 Mammographic calcification found on diagnostic imaging of breast: Secondary | ICD-10-CM

## 2023-02-26 ENCOUNTER — Encounter: Payer: Self-pay | Admitting: Physician Assistant

## 2023-02-27 ENCOUNTER — Ambulatory Visit
Admission: RE | Admit: 2023-02-27 | Discharge: 2023-02-27 | Disposition: A | Discharge status: Home / Self Care | Payer: Medicare PPO | Source: Ambulatory Visit | Attending: Physician Assistant | Admitting: Physician Assistant

## 2023-02-27 DIAGNOSIS — R921 Mammographic calcification found on diagnostic imaging of breast: Secondary | ICD-10-CM | POA: Diagnosis not present

## 2023-02-27 DIAGNOSIS — D0511 Intraductal carcinoma in situ of right breast: Secondary | ICD-10-CM | POA: Diagnosis not present

## 2023-02-27 DIAGNOSIS — R928 Other abnormal and inconclusive findings on diagnostic imaging of breast: Secondary | ICD-10-CM

## 2023-02-27 HISTORY — PX: BREAST BIOPSY: SHX20

## 2023-02-27 MED ORDER — LIDOCAINE-EPINEPHRINE 1 %-1:100000 IJ SOLN
20.0000 mL | Freq: Once | INTRAMUSCULAR | Status: AC
  Administered 2023-02-27: 20 mL

## 2023-02-27 MED ORDER — LIDOCAINE HCL (PF) 1 % IJ SOLN
5.0000 mL | Freq: Once | INTRAMUSCULAR | Status: AC
  Administered 2023-02-27: 5 mL

## 2023-02-28 ENCOUNTER — Encounter: Payer: Self-pay | Admitting: *Deleted

## 2023-02-28 LAB — SURGICAL PATHOLOGY

## 2023-02-28 NOTE — Progress Notes (Signed)
Received referral for newly diagnosed breast cancer from [facility].  Navigation initiated.  Sandra Franco is going to decide which surgeon she would like to see and give me a call back this week.  She would like to see Dr. Janese Banks as well as her husband sees her.   She doesn't want to make that appt. Until after surgical consultation.

## 2023-03-01 ENCOUNTER — Encounter: Payer: Self-pay | Admitting: *Deleted

## 2023-03-01 DIAGNOSIS — D0511 Intraductal carcinoma in situ of right breast: Secondary | ICD-10-CM

## 2023-03-01 NOTE — Progress Notes (Signed)
Sandra Franco would like to see Dr. Christian Mate, referral has been placed.   I will contact her after she sees Dr. Christian Mate to see if she would like to see Dr. Janese Banks before or after surgery.

## 2023-03-05 ENCOUNTER — Other Ambulatory Visit: Payer: Self-pay | Admitting: Physician Assistant

## 2023-03-05 DIAGNOSIS — E7800 Pure hypercholesterolemia, unspecified: Secondary | ICD-10-CM

## 2023-03-05 MED ORDER — ROSUVASTATIN CALCIUM 5 MG PO TABS: 5.0000 mg | ORAL_TABLET | Freq: Every day | ORAL | 3 refills | Status: DC

## 2023-03-06 ENCOUNTER — Ambulatory Visit: Payer: Medicare PPO | Admitting: Surgery

## 2023-03-06 ENCOUNTER — Encounter: Payer: Self-pay | Admitting: Surgery

## 2023-03-06 VITALS — BP 150/87 | HR 82 | Temp 98.1°F | Ht 65.0 in | Wt 113.6 lb

## 2023-03-06 DIAGNOSIS — D0511 Intraductal carcinoma in situ of right breast: Secondary | ICD-10-CM

## 2023-03-06 NOTE — Progress Notes (Signed)
Patient ID: Sandra Franco, female   DOB: 1951-08-15, 72 y.o.   MRN: [id number]  Chief Complaint: DCIS right breast  History of Present Illness Sandra Franco is a 72 y.o. female with an abnormal screening mammogram, new area of linear branching microcalcifications noted, stereotactic guided core biopsy reveals DCIS.  2 other areas of known stable calcifications, previously biopsied 2013 as benign. She has a remote history of birth control use and hormonal therapy.  She has a maternal aunt and a maternal cousin with breast cancer.  She began menstruating at the age of 4 she is gravida 2 para 1 with 1 abortion.  She is 25 at the time of her first pregnancy.  She denies any breast pain, palpable masses, skin changes, nipple discharge. She has not had good experiences with either of her stereotactic biopsies, and cringes at the idea of repeating this 2 more times.  At the same time she would like to avoid mastectomy.  Past Medical History Past Medical History:  Diagnosis Date   Depression    Heavy alcohol consumption    Mood disorder    Osteopenia    Tobacco use disorder    Tubular adenoma of colon 2019   needs another colonoscopy in 3 years      Past Surgical History:  Procedure Laterality Date   BREAST BIOPSY Right 2014   NEG   BREAST BIOPSY Right 02/27/2023   Right Breast Bx, Ribbon clip - path pending   BREAST BIOPSY Right 02/27/2023   MM RT BREAST BX W LOC DEV 1ST LESION IMAGE BX SPEC STEREO GUIDE 02/27/2023 [facility]   COLONOSCOPY  09/23/2012   negative   COLONOSCOPY WITH PROPOFOL N/A 08/09/2018   Procedure: COLONOSCOPY WITH PROPOFOL;  Surgeon: Scot Jun, MD;  [facility] ENDOSCOPY;  Service: Endoscopy;  Laterality: N/A;   FINGER FRACTURE SURGERY Right 1984   INDUCED ABORTION     TONSILLECTOMY      Allergies  Allergen Reactions   Amoxicillin Rash   Cefdinir Rash   Codeine Nausea Only    Current Outpatient Medications  Medication Sig Dispense Refill    buPROPion (WELLBUTRIN XL) 300 MG 24 hr tablet Take 1 tablet (300 mg total) by mouth daily. 90 tablet 0   Calcium Citrate-Vitamin D (CALCIUM + D PO) Take 1 capsule by mouth daily.     ibuprofen (ADVIL) 200 MG tablet Take 200 mg by mouth every 6 (six) hours as needed.     Multiple Vitamin (MULTIVITAMIN) capsule Take 1 capsule by mouth daily.     rosuvastatin (CRESTOR) 5 MG tablet Take 1 tablet (5 mg total) by mouth daily. 90 tablet 3   No current facility-administered medications for this visit.    Family History Family History  Problem Relation Age of Onset   Rheum arthritis Mother    Hypertension Mother    Lung cancer Father 53   Pancreatic cancer Maternal Aunt 12   Breast cancer Neg Hx       Social History Social History   Tobacco Use   Smoking status: Every Day    Packs/day: 0.50    Years: 51.00    Additional pack years: 0.00    Total pack years: 25.50    Types: Cigarettes   Smokeless tobacco: Never  Vaping Use   Vaping Use: Never used  Substance Use Topics   Alcohol use: Yes    Alcohol/week: 14.0 - 21.0 standard drinks of alcohol    Types: 14 - 21 Cans of beer per  week    Comment: 2-3   Drug use: No        Review of Systems  Constitutional: Negative.   HENT: Negative.    Eyes: Negative.   Respiratory:  Positive for cough and sputum production.   Cardiovascular: Negative.   Gastrointestinal: Negative.   Genitourinary: Negative.   Skin: Negative.   Neurological: Negative.   Psychiatric/Behavioral:  Positive for depression.      Physical Exam Blood pressure (!) 150/87, pulse 82, temperature 98.1 F (36.7 C), temperature source Oral, height 5\' 5"  (1.651 m), weight 113 lb 9.6 oz (51.5 kg), SpO2 98 %. Last Weight  Most recent update: 03/06/2023 10:53 AM    Weight  51.5 kg (113 lb 9.6 oz)             CONSTITUTIONAL: Well developed, and nourished, appropriately responsive and aware without distress.   EYES: Sclera non-icteric.   EARS, NOSE, MOUTH AND  THROAT:  The oropharynx is clear. Oral mucosa is pink and moist.   Hearing is intact to voice.  NECK: Trachea is midline, and there is no jugular venous distension.  LYMPH NODES:  Lymph nodes in the neck are not appreciated. RESPIRATORY:  Lungs are clear, and breath sounds are equal bilaterally.  Normal respiratory effort without pathologic use of accessory muscles. CARDIOVASCULAR: Heart is regular in rate and rhythm.  Well perfused.  GI: The abdomen is  soft, nontender, and nondistended. GU: Marylene Land present as chaperone, palpable changes involving the biopsy site with evidence of ecchymosis, clearly nondiagnostic. MUSCULOSKELETAL:  Symmetrical muscle tone appreciated in all four extremities.    SKIN: Skin turgor is normal. No pathologic skin lesions appreciated.  NEUROLOGIC:  Motor and sensation appear grossly normal.  Cranial nerves are grossly without defect. PSYCH:  Alert and oriented to person, place and time. Affect is appropriate for situation.  Data Reviewed I have personally reviewed what is currently available of the patient's imaging, recent labs and medical records.   Labs:     Latest Ref Rng & Units 12/26/2021   11:31 AM 01/25/2019   10:57 AM 10/02/2018    4:18 PM  CBC  WBC 3.4 - 10.8 x10E3/uL 6.6  6.0  6.5   Hemoglobin 11.1 - 15.9 g/dL 85.4  62.7  03.5   Hematocrit 34.0 - 46.6 % 43.9  42.3  44.8   Platelets 150 - 450 x10E3/uL 146  240  114       Latest Ref Rng & Units 12/26/2021   11:31 AM 10/02/2018    4:18 PM  CMP  Glucose 70 - 99 mg/dL 84  74   BUN 8 - 27 mg/dL 11  8   Creatinine 0.09 - 1.00 mg/dL 3.81  8.29   Sodium 937 - 144 mmol/L 139  141   Potassium 3.5 - 5.2 mmol/L 4.4  5.1   Chloride 96 - 106 mmol/L 100  98   CO2 20 - 29 mmol/L 26  26   Calcium 8.7 - 10.3 mg/dL 16.9  67.8   Total Protein 6.0 - 8.5 g/dL 7.3  7.2   Total Bilirubin 0.0 - 1.2 mg/dL 0.5  0.5   Alkaline Phos 44 - 121 IU/L 86  81   AST 0 - 40 IU/L 26  22   ALT 0 - 32 IU/L 22  16    SURGICAL  PATHOLOGY CASE: [id number] PATIENT: [name] Surgical Pathology Report  Specimen Submitted: A. Breast, right outer  Clinical History: Linear branching calcifications in right outer  breast middle depth (ribbon)  DIAGNOSIS: A.  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 - DCIS IS PRESENT IN 4 OF 5 BLOCKS.  Comment: ER testing is deferred to excision specimen.  GROSS DESCRIPTION: A. Labeled: Right breast stereo biopsy calcs outer middle depth Received: in a formalin-filled [place] collection device Specimen radiograph image(s) available for review Time/Date in fixative: Collected at 8:57 AM on 02/27/2023 and placed in formalin at 8:58 AM on 02/27/2023 Cold ischemic time: Less than 5 minutes Total fixation time: Approximately 8.25 hours Core pieces: Multiple Measurement: Aggregate, 8 x 0.8 x 0.2 cm Description / comments: Received are cores and fragments of yellow fibrofatty tissue.  The accompanying diagram has sections C-E checked. Inked: Blue Entirely submitted in cassette(s):  1 - section C 2 - section D 3 - section E 4 - 5 - remaining tissue fragments      4 - sections A and B      5 - sections F and G with remaining free-floating fragments  RB 02/27/2023 Final Diagnosis performed by Elijah Birkara Rubinas, MD.   Electronically signed 02/28/2023 9:07:25AM   Imaging: Radiological images reviewed:  CLINICAL DATA:  The patient was called back for right breast calcifications.   EXAM: DIGITAL DIAGNOSTIC UNILATERAL RIGHT MAMMOGRAM WITH TOMOSYNTHESIS   TECHNIQUE: Right digital diagnostic mammography and breast tomosynthesis was performed.   COMPARISON:  Previous exam(s).   ACR Breast Density Category c: The breasts are heterogeneously dense, which may obscure small masses.   FINDINGS: Linear branching calcifications are identified in the upper outer right  breast spanning 3.8 mm. Calcifications are identified anterior and superior to the new linear branching calcifications. The majority of the remaining calcifications are stable. The anterior aspect of calcifications biopsied in the past and benign.   IMPRESSION: New linear branching calcifications in the upper outer right breast. Relatively stable calcifications previously biopsied anteriorly and benign, anterior and posterior to the linear branching calcifications.   RECOMMENDATION: Recommend stereotactic biopsy of the new linear branching calcifications. If the biopsy demonstrates malignancy and lumpectomy is desired, recommend biopsying the anterior and posterior extent of remaining calcifications.   I have discussed the findings and recommendations with the patient. If applicable, a reminder letter will be sent to the patient regarding the next appointment.   BI-RADS CATEGORY  4: Suspicious.     Electronically Signed   By: Gerome Samavid  Williams III M.D.   On: 02/20/2023 16:20  Within last 24 hrs: No results found.  Assessment    DCIS right breast, additional right breast calcifications warranting further evaluation per radiology. Patient Active Problem List   Diagnosis Date Noted   COVID-19 11/23/2022   Elevated blood-pressure reading without diagnosis of hypertension 12/26/2021   Screening mammogram, encounter for 12/26/2020   Thrombocytopenia 01/20/2019   Tobacco use disorder 10/04/2018   Heavy alcohol consumption 04/23/2018   Depression 04/23/2018   Osteopenia     Plan    As she prefers breast conservation, radiologist recommended biopsying the other calcifications, she is reluctant to proceed with stereotactic biopsing, but sincerely hopes to avoid mastectomy.  We discussed the option of proceeding with MRI evaluation and will schedule breast MRI and have her follow-up immediately after.  Face-to-face time spent with the patient and accompanying care providers(if  present) was 40 minutes, with more than 50% of the time spent counseling, educating, and coordinating care of the patient.    These notes generated with voice  recognition software. I apologize for typographical errors.  Campbell Lerner M.D., FACS 03/06/2023, 11:31 AM

## 2023-03-06 NOTE — Patient Instructions (Addendum)
Your breast MRI is scheduled for 03/13/2023 9:30 am (arrive by 9 am) at [facility]. Nothing to eat or drink 4 hours prior.    If you have any concerns or questions, please feel free to call our office. See follow up appointment below.   Ductal Carcinoma In Situ  Ductal carcinoma in situ is the presence of abnormal cells in the breast. It is the earliest form of breast cancer. The abnormal cells are located only in the tubes that carry milk to the nipple (milk ducts) and have not spread to other areas. What are the causes? The exact cause of ductal carcinoma in situ is unknown. What increases the risk? The following factors increase the risk of developing ductal carcinoma in situ: Being older than 74 years of age. Being female. Having a family history of breast cancer. Starting menopause after age 11. Starting your menstrual periods before age 85. Having never been pregnant or having your first child after age 30. Having never breastfed. A personal history of: Breast cancer. Dense breast tissue. Radiation treatments to the breasts or chest area. Having the BRCA1 and BRCA2 genes. Having certain types of noncancerous (benign) breast conditions, such as non-proliferative lesions, proliferative lesions with or without atypia (cell abnormalities), or lobular carcinoma in situ. Exposure to the drug DES, which was given to pregnant women from the 1940s to the 1970s. Other risks include: Current or past hormone use, such as: Using birth control. Taking hormone therapy after menopause. Being overweight or obese after menopause. Having an inactive (sedentary) lifestyle. Drinking more than one alcoholic beverage a day. What are the signs or symptoms? Ductal carcinoma in situ does not cause any symptoms. How is this diagnosed? Ductal carcinoma in situ is usually discovered during a routine X-ray of the breasts to check for abnormal changes (mammogram). To diagnose the condition, your  health care provider may do an ultrasound and remove a tissue sample from your breast so it can be examined under a microscope (breast biopsy). Your health care provider may also remove one or more lymph nodes from under your arm to check if the abnormal cells have spread to your lymph nodes (sentinel lymph node biopsy). Lymph nodes are part of the body's disease-fighting system (immune system). They are located throughout the body. The lymph nodes under the arms are usually the first place where abnormal cells spread. How is this treated? Treatment for this condition may include: A lumpectomy. This is surgery to remove the area of abnormal cells, along with a ring of normal tissue. This may also be called breast-conserving surgery. Simple mastectomy. This is surgery to remove breast tissue, the nipple, and the circle of colored tissue around the nipple (areola). Sometimes, one or more lymph nodes from under the arm are also removed and tested for cancer cells. Preventive mastectomy. This is the removal of both breasts. This is usually done only if you have a very high risk of developing breast cancer. Radiation. This is the use of high-energy rays to kill cancer cells. Hormone therapy, which are medicines to keep the abnormal cells from spreading. Follow these instructions at home: Lifestyle Eat a healthy diet. A healthy diet includes lots of fruits and vegetables, low-fat dairy products, lean meats, and fiber. Make sure half your plate is filled with fruits or vegetables. Choose high-fiber foods such as whole-grain breads and cereals. Do not use any products that contain nicotine or tobacco. These products include cigarettes, chewing tobacco, and vaping devices, such as e-cigarettes.  If you need help quitting, ask your health care provider. Alcohol use Do not drink alcohol if: Your health care provider tells you not to drink. You are pregnant, may be pregnant, or are planning to become  pregnant. If you drink alcohol: Limit how much you have to 0-1 drink a day. Know how much alcohol is in your drink. In the [place] one drink equals one 12 oz bottle of beer (355 mL), one 5 oz glass of wine (148 mL), or one 1 oz glass of hard liquor (44 mL). General instructions Take over-the-counter and prescription medicines only as told by your health care provider. Keep all follow-up visits. This is important. Where to find more information American Cancer Society: www.cancer.org National Cancer Institute: www.cancer.gov Contact a health care provider if: You have a fever. You notice a new lump in either breast or under your arm. You have any symptoms or changes that concern you. You notice new fatigue or weakness. Get help right away if: You have chest pain or trouble breathing. These symptoms may be an emergency. Get help right away. Call 911. Do not wait to see if the symptoms will go away. Do not drive yourself to the hospital. Summary Ductal carcinoma in situ is the presence of abnormal cells in the breast. It is the earliest form of breast cancer. The exact cause of ductal carcinoma in situ is unknown. Among other factors, the risk increases with age and with current or past hormone use. To diagnose the condition, your health care provider will remove a tissue sample from your breast so it can be examined under a microscope (breast biopsy). This information is not intended to replace advice given to you by your health care provider. Make sure you discuss any questions you have with your health care provider. Document Revised: 08/18/2021 Document Reviewed: 08/18/2021 Elsevier Patient Education  2023 [employer].

## 2023-03-13 ENCOUNTER — Ambulatory Visit
Admission: RE | Admit: 2023-03-13 | Discharge: 2023-03-13 | Disposition: A | Discharge status: Home / Self Care | Payer: Medicare PPO | Source: Ambulatory Visit | Attending: Surgery | Admitting: Surgery

## 2023-03-13 DIAGNOSIS — D0511 Intraductal carcinoma in situ of right breast: Secondary | ICD-10-CM

## 2023-03-13 MED ORDER — GADOBUTROL 1 MMOL/ML IV SOLN
5.0000 mL | Freq: Once | INTRAVENOUS | Status: AC | PRN
  Administered 2023-03-13: 5 mL via INTRAVENOUS

## 2023-03-14 NOTE — Progress Notes (Unsigned)
Patient ID: Sandra Franco, female   DOB: [birth year], 72 y.o.   MRN: [id number]  Chief Complaint: DCIS right breast  History of Present Illness Her MRI shows:   CLINICAL DATA:  RIGHT breast DCIS.   EXAM: BILATERAL BREAST MRI WITH AND WITHOUT CONTRAST   TECHNIQUE: Multiplanar, multisequence MR images of both breasts were obtained prior to and following the intravenous administration of 5 ml of Gadavist   Three-dimensional MR images were rendered by post-processing of the original MR data on an independent workstation. The three-dimensional MR images were interpreted, and findings are reported in the following complete MRI report for this study. Three dimensional images were evaluated at the independent interpreting workstation using the DynaCAD thin client.   COMPARISON:  Previous exams including RIGHT breast diagnostic mammogram dated 02/20/2023.   FINDINGS: Breast composition: c. Heterogeneous fibroglandular tissue.   Background parenchymal enhancement: Mild   Right breast: Biopsy site marker within the upper outer quadrant of the RIGHT breast. Focal non-mass enhancement surrounding the biopsy clip artifact, measuring up to 11 mm greatest dimension, compatible with the biopsy-proven DCIS and likely accentuated by post biopsy change (series 12, image 45).   No additional suspicious enhancing mass, suspicious non-mass enhancement or secondary signs of malignancy elsewhere within the RIGHT breast.   Left breast: No suspicious enhancing mass, suspicious non-mass enhancement or secondary signs of malignancy in the LEFT breast.   Lymph nodes: No abnormal appearing lymph nodes.   Ancillary findings:  None.   IMPRESSION: 1. Biopsy-proven DCIS within the upper-outer quadrant of the RIGHT breast, with associated biopsy clip artifact, with associated focal non-mass enhancement which measures up to 11 mm greatest dimension but which is likely accentuated in size by post biopsy  change. The biopsied calcifications which revealed DCIS measured 4 mm extent. 2. No evidence of multifocal or multicentric disease in the RIGHT breast. 3. No evidence of contralateral disease in the LEFT breast.   RECOMMENDATION: 1. If breast conservation surgery is being considered, an additional 2-site stereotactic biopsy was recommended for additional indeterminate calcifications in the RIGHT breast as outlined on diagnostic report of 02/20/2023. 2. Otherwise, per treatment plan for patient's biopsy-proven DCIS in the RIGHT breast.   BI-RADS CATEGORY  6: Known biopsy-proven malignancy.     Electronically Signed   By: Bary Richard M.D.   On: 03/13/2023 14:50   Sandra Franco is a 72 y.o. female with an abnormal screening mammogram, new area of linear branching microcalcifications noted, stereotactic guided core biopsy reveals DCIS.  2 other areas of known stable calcifications, previously biopsied 2013 as benign. She has a remote history of birth control use and hormonal therapy.  She has a maternal aunt and a maternal cousin with breast cancer.  She began menstruating at the age of 49 she is gravida 2 para 1 with 1 abortion.  She is 25 at the time of her first pregnancy.  She denies any breast pain, palpable masses, skin changes, nipple discharge. She has not had good experiences with either of her stereotactic biopsies, and cringes at the idea of repeating this 2 more times.  At the same time she would like to avoid mastectomy.  Past Medical History Past Medical History:  Diagnosis Date   Depression    Heavy alcohol consumption    Mood disorder    Osteopenia    Tobacco use disorder    Tubular adenoma of colon 2019   needs another colonoscopy in 3 years      Past Surgical History:  Procedure Laterality Date   BREAST BIOPSY Right 2014   NEG   BREAST BIOPSY Right 02/27/2023   Right Breast Bx, Ribbon clip - path pending   BREAST BIOPSY Right 02/27/2023   MM RT BREAST BX W  LOC DEV 1ST LESION IMAGE BX SPEC STEREO GUIDE 02/27/2023 [facility]   COLONOSCOPY  09/23/2012   negative   COLONOSCOPY WITH PROPOFOL N/A 08/09/2018   Procedure: COLONOSCOPY WITH PROPOFOL;  Surgeon: Scot Jun, MD;  Location: [facility] ENDOSCOPY;  Service: Endoscopy;  Laterality: N/A;   FINGER FRACTURE SURGERY Right 1984   INDUCED ABORTION     TONSILLECTOMY      Allergies  Allergen Reactions   Amoxicillin Rash   Cefdinir Rash   Codeine Nausea Only    Current Outpatient Medications  Medication Sig Dispense Refill   buPROPion (WELLBUTRIN XL) 300 MG 24 hr tablet Take 1 tablet (300 mg total) by mouth daily. 90 tablet 0   Calcium Citrate-Vitamin D (CALCIUM + D PO) Take 1 capsule by mouth daily.     ibuprofen (ADVIL) 200 MG tablet Take 200 mg by mouth every 6 (six) hours as needed.     Multiple Vitamin (MULTIVITAMIN) capsule Take 1 capsule by mouth daily.     rosuvastatin (CRESTOR) 5 MG tablet Take 1 tablet (5 mg total) by mouth daily. 90 tablet 3   No current facility-administered medications for this visit.    Family History Family History  Problem Relation Age of Onset   Rheum arthritis Mother    Hypertension Mother    Lung cancer Father 52   Pancreatic cancer Maternal Aunt 5   Breast cancer Neg Hx       Social History Social History   Tobacco Use   Smoking status: Every Day    Packs/day: 0.50    Years: 51.00    Additional pack years: 0.00    Total pack years: 25.50    Types: Cigarettes   Smokeless tobacco: Never  Vaping Use   Vaping Use: Never used  Substance Use Topics   Alcohol use: Yes    Alcohol/week: 14.0 - 21.0 standard drinks of alcohol    Types: 14 - 21 Cans of beer per week    Comment: 2-3   Drug use: No        Review of Systems  Constitutional: Negative.   HENT: Negative.    Eyes: Negative.   Respiratory:  Positive for cough and sputum production.   Cardiovascular: Negative.   Gastrointestinal: Negative.   Genitourinary: Negative.    Skin: Negative.   Neurological: Negative.   Psychiatric/Behavioral:  Positive for depression.      Physical Exam There were no vitals taken for this visit.    CONSTITUTIONAL: Well developed, and nourished, appropriately responsive and aware without distress.   EYES: Sclera non-icteric.   EARS, NOSE, MOUTH AND THROAT:  The oropharynx is clear. Oral mucosa is pink and moist.   Hearing is intact to voice.  NECK: Trachea is midline, and there is no jugular venous distension.  LYMPH NODES:  Lymph nodes in the neck are not appreciated. RESPIRATORY:  Lungs are clear, and breath sounds are equal bilaterally.  Normal respiratory effort without pathologic use of accessory muscles. CARDIOVASCULAR: Heart is regular in rate and rhythm.  Well perfused.  GI: The abdomen is  soft, nontender, and nondistended. GU: Sandra Franco present as chaperone, palpable changes involving the biopsy site with evidence of ecchymosis, clearly nondiagnostic. MUSCULOSKELETAL:  Symmetrical muscle tone appreciated in all four extremities.  SKIN: Skin turgor is normal. No pathologic skin lesions appreciated.  NEUROLOGIC:  Motor and sensation appear grossly normal.  Cranial nerves are grossly without defect. PSYCH:  Alert and oriented to person, place and time. Affect is appropriate for situation.  Data Reviewed I have personally reviewed what is currently available of the patient's imaging, recent labs and medical records.   Labs:     Latest Ref Rng & Units 12/26/2021   11:31 AM 01/25/2019   10:57 AM 10/02/2018    4:18 PM  CBC  WBC 3.4 - 10.8 x10E3/uL 6.6  6.0  6.5   Hemoglobin 11.1 - 15.9 g/dL 16.1  09.6  04.5   Hematocrit 34.0 - 46.6 % 43.9  42.3  44.8   Platelets 150 - 450 x10E3/uL 146  240  114       Latest Ref Rng & Units 12/26/2021   11:31 AM 10/02/2018    4:18 PM  CMP  Glucose 70 - 99 mg/dL 84  74   BUN 8 - 27 mg/dL 11  8   Creatinine 4.09 - 1.00 mg/dL 8.11  9.14   Sodium 782 - 144 mmol/L 139  141    Potassium 3.5 - 5.2 mmol/L 4.4  5.1   Chloride 96 - 106 mmol/L 100  98   CO2 20 - 29 mmol/L 26  26   Calcium 8.7 - 10.3 mg/dL 95.6  21.3   Total Protein 6.0 - 8.5 g/dL 7.3  7.2   Total Bilirubin 0.0 - 1.2 mg/dL 0.5  0.5   Alkaline Phos 44 - 121 IU/L 86  81   AST 0 - 40 IU/L 26  22   ALT 0 - 32 IU/L 22  16    SURGICAL PATHOLOGY CASE: [id number] PATIENT: Sandra Franco Surgical Pathology Report  Specimen Submitted: A. Breast, right outer  Clinical History: Linear branching calcifications in right outer breast middle depth (ribbon)  DIAGNOSIS: A.  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 - DCIS IS PRESENT IN 4 OF 5 BLOCKS.  Comment: ER testing is deferred to excision specimen.  GROSS DESCRIPTION: A. Labeled: Right breast stereo biopsy calcs outer middle depth Received: in a formalin-filled [employer] collection device Specimen radiograph image(s) available for review Time/Date in fixative: Collected at 8:57 AM on 02/27/2023 and placed in formalin at 8:58 AM on 02/27/2023 Cold ischemic time: Less than 5 minutes Total fixation time: Approximately 8.25 hours Core pieces: Multiple Measurement: Aggregate, 8 x 0.8 x 0.2 cm Description / comments: Received are cores and fragments of yellow fibrofatty tissue.  The accompanying diagram has sections C-E checked. Inked: Blue Entirely submitted in cassette(s):  1 - section C 2 - section D 3 - section E 4 - 5 - remaining tissue fragments      4 - sections A and B      5 - sections F and G with remaining free-floating fragments  RB 02/27/2023 Final Diagnosis performed by Elijah Birk, MD.   Electronically signed 02/28/2023 9:07:25AM   Imaging: Radiological images reviewed:  CLINICAL DATA:  The patient was called back for right breast calcifications.   EXAM: DIGITAL DIAGNOSTIC UNILATERAL RIGHT MAMMOGRAM WITH  TOMOSYNTHESIS   TECHNIQUE: Right digital diagnostic mammography and breast tomosynthesis was performed.   COMPARISON:  Previous exam(s).   ACR Breast Density Category c: The breasts are heterogeneously dense, which may obscure small masses.   FINDINGS: Linear branching calcifications are identified in  the upper outer right breast spanning 3.8 mm. Calcifications are identified anterior and superior to the new linear branching calcifications. The majority of the remaining calcifications are stable. The anterior aspect of calcifications biopsied in the past and benign.   IMPRESSION: New linear branching calcifications in the upper outer right breast. Relatively stable calcifications previously biopsied anteriorly and benign, anterior and posterior to the linear branching calcifications.   RECOMMENDATION: Recommend stereotactic biopsy of the new linear branching calcifications. If the biopsy demonstrates malignancy and lumpectomy is desired, recommend biopsying the anterior and posterior extent of remaining calcifications.   I have discussed the findings and recommendations with the patient. If applicable, a reminder letter will be sent to the patient regarding the next appointment.   BI-RADS CATEGORY  4: Suspicious.     Electronically Signed   By: Gerome Sam III M.D.   On: 02/20/2023 16:20  Within last 24 hrs: No results found.  Assessment    DCIS right breast, additional right breast calcifications warranting further evaluation per radiology. Patient Active Problem List   Diagnosis Date Noted   COVID-19 11/23/2022   Elevated blood-pressure reading without diagnosis of hypertension 12/26/2021   Screening mammogram, encounter for 12/26/2020   Thrombocytopenia 01/20/2019   Tobacco use disorder 10/04/2018   Heavy alcohol consumption 04/23/2018   Depression 04/23/2018   Osteopenia     Plan    *** As she prefers breast conservation, radiologist recommended  biopsying the other calcifications, she is reluctant to proceed with stereotactic biopsing, but sincerely hopes to avoid mastectomy.  We discussed the option of proceeding with MRI evaluation and will schedule breast MRI and have her follow-up immediately after.  Face-to-face time spent with the patient and accompanying care providers(if present) was 40 minutes, with more than 50% of the time spent counseling, educating, and coordinating care of the patient.    These notes generated with voice recognition software. I apologize for typographical errors.  Campbell Lerner M.D., FACS 03/14/2023, 9:49 PM

## 2023-03-15 ENCOUNTER — Other Ambulatory Visit: Payer: Self-pay | Admitting: Surgery

## 2023-03-15 ENCOUNTER — Ambulatory Visit: Payer: Medicare PPO | Admitting: Surgery

## 2023-03-15 ENCOUNTER — Encounter: Payer: Self-pay | Admitting: Surgery

## 2023-03-15 VITALS — BP 153/90 | HR 87 | Temp 98.1°F | Ht 65.0 in | Wt 114.0 lb

## 2023-03-15 DIAGNOSIS — D0511 Intraductal carcinoma in situ of right breast: Secondary | ICD-10-CM | POA: Insufficient documentation

## 2023-03-15 DIAGNOSIS — R928 Other abnormal and inconclusive findings on diagnostic imaging of breast: Secondary | ICD-10-CM

## 2023-03-15 DIAGNOSIS — R921 Mammographic calcification found on diagnostic imaging of breast: Secondary | ICD-10-CM

## 2023-03-15 NOTE — Patient Instructions (Signed)
If you have any concerns or questions, please feel free to call our office.   Ductal Carcinoma In Situ  Ductal carcinoma in situ is the presence of abnormal cells in the breast. It is the earliest form of breast cancer. The abnormal cells are located only in the tubes that carry milk to the nipple (milk ducts) and have not spread to other areas. What are the causes? The exact cause of ductal carcinoma in situ is unknown. What increases the risk? The following factors increase the risk of developing ductal carcinoma in situ: Being older than 74 years of age. Being female. Having a family history of breast cancer. Starting menopause after age 8. Starting your menstrual periods before age 32. Having never been pregnant or having your first child after age 25. Having never breastfed. A personal history of: Breast cancer. Dense breast tissue. Radiation treatments to the breasts or chest area. Having the BRCA1 and BRCA2 genes. Having certain types of noncancerous (benign) breast conditions, such as non-proliferative lesions, proliferative lesions with or without atypia (cell abnormalities), or lobular carcinoma in situ. Exposure to the drug DES, which was given to pregnant women from the 1940s to the 1970s. Other risks include: Current or past hormone use, such as: Using birth control. Taking hormone therapy after menopause. Being overweight or obese after menopause. Having an inactive (sedentary) lifestyle. Drinking more than one alcoholic beverage a day. What are the signs or symptoms? Ductal carcinoma in situ does not cause any symptoms. How is this diagnosed? Ductal carcinoma in situ is usually discovered during a routine X-ray of the breasts to check for abnormal changes (mammogram). To diagnose the condition, your health care provider may do an ultrasound and remove a tissue sample from your breast so it can be examined under a microscope (breast biopsy). Your health care provider  may also remove one or more lymph nodes from under your arm to check if the abnormal cells have spread to your lymph nodes (sentinel lymph node biopsy). Lymph nodes are part of the body's disease-fighting system (immune system). They are located throughout the body. The lymph nodes under the arms are usually the first place where abnormal cells spread. How is this treated? Treatment for this condition may include: A lumpectomy. This is surgery to remove the area of abnormal cells, along with a ring of normal tissue. This may also be called breast-conserving surgery. Simple mastectomy. This is surgery to remove breast tissue, the nipple, and the circle of colored tissue around the nipple (areola). Sometimes, one or more lymph nodes from under the arm are also removed and tested for cancer cells. Preventive mastectomy. This is the removal of both breasts. This is usually done only if you have a very high risk of developing breast cancer. Radiation. This is the use of high-energy rays to kill cancer cells. Hormone therapy, which are medicines to keep the abnormal cells from spreading. Follow these instructions at home: Lifestyle Eat a healthy diet. A healthy diet includes lots of fruits and vegetables, low-fat dairy products, lean meats, and fiber. Make sure half your plate is filled with fruits or vegetables. Choose high-fiber foods such as whole-grain breads and cereals. Do not use any products that contain nicotine or tobacco. These products include cigarettes, chewing tobacco, and vaping devices, such as e-cigarettes. If you need help quitting, ask your health care provider. Alcohol use Do not drink alcohol if: Your health care provider tells you not to drink. You are pregnant, may be pregnant, or  are planning to become pregnant. If you drink alcohol: Limit how much you have to 0-1 drink a day. Know how much alcohol is in your drink. In the [place] one drink equals one 12 oz bottle of beer (355  mL), one 5 oz glass of wine (148 mL), or one 1 oz glass of hard liquor (44 mL). General instructions Take over-the-counter and prescription medicines only as told by your health care provider. Keep all follow-up visits. This is important. Where to find more information American Cancer Society: www.cancer.org National Cancer Institute: www.cancer.gov Contact a health care provider if: You have a fever. You notice a new lump in either breast or under your arm. You have any symptoms or changes that concern you. You notice new fatigue or weakness. Get help right away if: You have chest pain or trouble breathing. These symptoms may be an emergency. Get help right away. Call 911. Do not wait to see if the symptoms will go away. Do not drive yourself to the hospital. Summary Ductal carcinoma in situ is the presence of abnormal cells in the breast. It is the earliest form of breast cancer. The exact cause of ductal carcinoma in situ is unknown. Among other factors, the risk increases with age and with current or past hormone use. To diagnose the condition, your health care provider will remove a tissue sample from your breast so it can be examined under a microscope (breast biopsy). This information is not intended to replace advice given to you by your health care provider. Make sure you discuss any questions you have with your health care provider. Document Revised: 08/18/2021 Document Reviewed: 08/18/2021 Elsevier Patient Education  2023 [employer].

## 2023-03-21 ENCOUNTER — Ambulatory Visit
Admission: RE | Admit: 2023-03-21 | Discharge: 2023-03-21 | Disposition: A | Discharge status: Home / Self Care | Payer: Medicare PPO | Source: Ambulatory Visit | Attending: Surgery | Admitting: Surgery

## 2023-03-21 DIAGNOSIS — R921 Mammographic calcification found on diagnostic imaging of breast: Secondary | ICD-10-CM

## 2023-03-21 DIAGNOSIS — R920 Mammographic microcalcification found on diagnostic imaging of breast: Secondary | ICD-10-CM | POA: Diagnosis not present

## 2023-03-21 DIAGNOSIS — R928 Other abnormal and inconclusive findings on diagnostic imaging of breast: Secondary | ICD-10-CM

## 2023-03-21 DIAGNOSIS — D0511 Intraductal carcinoma in situ of right breast: Secondary | ICD-10-CM | POA: Diagnosis not present

## 2023-03-21 HISTORY — PX: BREAST BIOPSY: SHX20

## 2023-03-21 MED ORDER — LIDOCAINE HCL (PF) 1 % IJ SOLN
5.0000 mL | Freq: Once | INTRAMUSCULAR | Status: AC
  Administered 2023-03-21: 5 mL
  Filled 2023-03-21: qty 5

## 2023-03-21 MED ORDER — LIDOCAINE-EPINEPHRINE (PF) 1 %-1:200000 IJ SOLN
10.0000 mL | Freq: Once | INTRAMUSCULAR | Status: AC
  Administered 2023-03-21: 10 mL
  Filled 2023-03-21: qty 10

## 2023-03-21 MED ORDER — LIDOCAINE-EPINEPHRINE 1 %-1:100000 IJ SOLN
10.0000 mL | Freq: Once | INTRAMUSCULAR | Status: AC
  Administered 2023-03-21: 10 mL
  Filled 2023-03-21: qty 10

## 2023-03-23 LAB — SURGICAL PATHOLOGY

## 2023-03-26 ENCOUNTER — Other Ambulatory Visit: Payer: Self-pay

## 2023-03-26 DIAGNOSIS — D0511 Intraductal carcinoma in situ of right breast: Secondary | ICD-10-CM

## 2023-03-27 ENCOUNTER — Other Ambulatory Visit: Payer: Self-pay | Admitting: Surgery

## 2023-03-27 DIAGNOSIS — R928 Other abnormal and inconclusive findings on diagnostic imaging of breast: Secondary | ICD-10-CM

## 2023-03-27 DIAGNOSIS — D0511 Intraductal carcinoma in situ of right breast: Secondary | ICD-10-CM

## 2023-03-29 ENCOUNTER — Ambulatory Visit: Payer: Self-pay | Admitting: Surgery

## 2023-03-29 ENCOUNTER — Telehealth: Payer: Self-pay | Admitting: Surgery

## 2023-03-29 DIAGNOSIS — D0511 Intraductal carcinoma in situ of right breast: Secondary | ICD-10-CM

## 2023-03-29 NOTE — Telephone Encounter (Signed)
Patient calls back, she is not informed of all dates regarding her surgery.

## 2023-03-29 NOTE — Telephone Encounter (Signed)
Left message for patient to call, please inform her of the following regarding scheduled surgery with Dr. Claudine Mouton.   Pre-Admission date/time, and Surgery date at [facility].  Surgery Date: 04/18/23, patient will need to arrive no later than 12:00 at [facility] as will have the SLN injection done prior to her surgery.    Preadmission Testing Date: 04/10/23 (phone 8a-1p)  Also remind patient of RF tag to be placed at [facility] on 04/05/23.

## 2023-03-29 NOTE — Telephone Encounter (Signed)
Per Dr. Claudine Mouton will not need the SLN bx to be done, this was cancelled.  Patient will now need to still call the day before prior to surgery for her arrival time.

## 2023-04-05 ENCOUNTER — Ambulatory Visit
Admission: RE | Admit: 2023-04-05 | Discharge: 2023-04-05 | Disposition: A | Discharge status: Home / Self Care | Payer: Medicare PPO | Source: Ambulatory Visit | Attending: Surgery | Admitting: Surgery

## 2023-04-05 DIAGNOSIS — D0511 Intraductal carcinoma in situ of right breast: Secondary | ICD-10-CM | POA: Insufficient documentation

## 2023-04-05 DIAGNOSIS — R928 Other abnormal and inconclusive findings on diagnostic imaging of breast: Secondary | ICD-10-CM | POA: Insufficient documentation

## 2023-04-05 HISTORY — PX: BREAST BIOPSY: SHX20

## 2023-04-05 MED ORDER — LIDOCAINE HCL (PF) 1 % IJ SOLN
10.0000 mL | Freq: Once | INTRAMUSCULAR | Status: AC
  Administered 2023-04-05: 10 mL
  Filled 2023-04-05: qty 10

## 2023-04-06 ENCOUNTER — Encounter: Payer: Self-pay | Admitting: Surgery

## 2023-04-10 ENCOUNTER — Encounter
Admission: RE | Admit: 2023-04-10 | Discharge: 2023-04-10 | Disposition: A | Discharge status: Home / Self Care | Payer: Medicare PPO | Source: Ambulatory Visit | Attending: Surgery | Admitting: Surgery

## 2023-04-10 ENCOUNTER — Other Ambulatory Visit: Payer: Self-pay

## 2023-04-10 VITALS — Ht 65.0 in | Wt 114.0 lb

## 2023-04-10 DIAGNOSIS — R030 Elevated blood-pressure reading, without diagnosis of hypertension: Secondary | ICD-10-CM

## 2023-04-10 DIAGNOSIS — F17200 Nicotine dependence, unspecified, uncomplicated: Secondary | ICD-10-CM

## 2023-04-10 DIAGNOSIS — F1090 Alcohol use, unspecified, uncomplicated: Secondary | ICD-10-CM

## 2023-04-10 HISTORY — DX: Chronic obstructive pulmonary disease, unspecified: J44.9

## 2023-04-10 HISTORY — DX: COVID-19: U07.1

## 2023-04-10 HISTORY — DX: Thrombocytopenia, unspecified: D69.6

## 2023-04-10 NOTE — Patient Instructions (Addendum)
Your procedure is scheduled on: Wednesday 04/18/23 To find out your arrival time, please call [phone] between 1PM - 3PM on:   Tuesday 04/17/23 Report to the Registration Desk on the 1st floor of the [facility]. Free Valet parking is available.  If your arrival time is 6:00 am, do not arrive before that time as the [facility] entrance doors do not open until 6:00 am.  REMEMBER: Instructions that are not followed completely may result in serious medical risk, up to and including death; or upon the discretion of your surgeon and anesthesiologist your surgery may need to be rescheduled.  Do not eat food or drink any liquids after midnight the night before surgery.  No gum chewing or hard candies.  One week prior to surgery: Stop Anti-inflammatories (NSAIDS) such as Advil, Aleve, Ibuprofen, Motrin, Naproxen, Naprosyn and Aspirin based products such as Excedrin, Goody's Powder, BC Powder. You may however, continue to take Tylenol if needed for pain up until the day of surgery.  Stop ANY OVER THE COUNTER supplements or vitamins until after surgery.  Continue taking all prescribed medications.   TAKE ONLY THESE MEDICATIONS THE MORNING OF SURGERY WITH A SIP OF WATER:  buPROPion (WELLBUTRIN XL) 300 MG 24 hr tablet  rosuvastatin (CRESTOR) 5 MG tablet   No Alcohol for 24 hours before or after surgery.  No Smoking including e-cigarettes for 24 hours before surgery.  No chewable tobacco products for at least 6 hours before surgery.  No nicotine patches on the day of surgery.  Do not use any "recreational" drugs for at least a week (preferably 2 weeks) before your surgery.  Please be advised that the combination of cocaine and anesthesia may have negative outcomes, up to and including death. If you test positive for cocaine, your surgery will be cancelled.  On the morning of surgery brush your teeth with toothpaste and water, you may rinse your mouth with mouthwash if you wish. Do not  swallow any toothpaste or mouthwash.  Use CHG Soap or wipes as directed on instruction sheet.  Do not wear lotions, powders, or perfumes. NO DEODORANT  Do not shave body hair from the neck down 48 hours before surgery.  Wear comfortable clothing (specific to your surgery type) to the hospital.  Do not wear jewelry, make-up, hairpins, clips or nail polish.  Contact lenses, hearing aids and dentures may not be worn into surgery.  Do not bring valuables to the hospital. [facility] is not responsible for any missing/lost belongings or valuables.   Notify your doctor if there is any change in your medical condition (cold, fever, infection).  If you are being discharged the day of surgery, you will not be allowed to drive home. You will need a responsible individual to drive you home and stay with you for 24 hours after surgery.   If you are taking public transportation, you will need to have a responsible individual with you.  If you are being admitted to the hospital overnight, leave your suitcase in the car. After surgery it may be brought to your room.  In case of increased patient census, it may be necessary for you, the patient, to continue your postoperative care in the Same Day Surgery department.  After surgery, you can help prevent lung complications by doing breathing exercises.  Take deep breaths and cough every 1-2 hours. Your doctor may order a device called an Incentive Spirometer to help you take deep breaths. When coughing or sneezing, hold a pillow  firmly against your incision with both hands. This is called "splinting." Doing this helps protect your incision. It also decreases belly discomfort.  Surgery Visitation Policy:  Patients undergoing a surgery or procedure may have two family members or support persons with them as long as the person is not COVID-19 positive or experiencing its symptoms.   Inpatient Visitation:    Visiting hours are 7 a.m. to 8 p.m. Up to  four visitors are allowed at one time in a patient room. The visitors may rotate out with other people during the day. One designated support person (adult) may remain overnight.  Please call the Pre-admissions Testing Dept. at [phone] if you have any questions about these instructions.     Preparing for Surgery with CHLORHEXIDINE GLUCONATE (CHG) Soap  Chlorhexidine Gluconate (CHG) Soap  o An antiseptic cleaner that kills germs and bonds with the skin to continue killing germs even after washing  o Used for showering the night before surgery and morning of surgery  Before surgery, you can play an important role by reducing the number of germs on your skin.  CHG (Chlorhexidine gluconate) soap is an antiseptic cleanser which kills germs and bonds with the skin to continue killing germs even after washing.  Please do not use if you have an allergy to CHG or antibacterial soaps. If your skin becomes reddened/irritated stop using the CHG.  1. Shower the NIGHT BEFORE SURGERY and the MORNING OF SURGERY with CHG soap.  2. If you choose to wash your hair, wash your hair first as usual with your normal shampoo.  3. After shampooing, rinse your hair and body thoroughly to remove the shampoo.  4. Use CHG as you would any other liquid soap. You can apply CHG directly to the skin and wash gently with a scrungie or a clean washcloth.  5. Apply the CHG soap to your body only from the neck down. Do not use on open wounds or open sores. Avoid contact with your eyes, ears, mouth, and genitals (private parts). Wash face and genitals (private parts) with your normal soap.  6. Wash thoroughly, paying special attention to the area where your surgery will be performed.  7. Thoroughly rinse your body with warm water.  8. Do not shower/wash with your normal soap after using and rinsing off the CHG soap.  9. Pat yourself dry with a clean towel.  10. Wear clean pajamas to bed the night before  surgery.  12. Place clean sheets on your bed the night of your first shower and do not sleep with pets.  13. Shower again with the CHG soap on the day of surgery prior to arriving at the hospital.  14. Do not apply any deodorants/lotions/powders.  15. Please wear clean clothes to the hospital.

## 2023-04-12 ENCOUNTER — Encounter: Payer: Self-pay | Admitting: Urgent Care

## 2023-04-12 ENCOUNTER — Encounter
Admission: RE | Admit: 2023-04-12 | Discharge: 2023-04-12 | Disposition: A | Discharge status: Home / Self Care | Payer: Medicare PPO | Source: Ambulatory Visit | Attending: Surgery | Admitting: Surgery

## 2023-04-12 DIAGNOSIS — F17200 Nicotine dependence, unspecified, uncomplicated: Secondary | ICD-10-CM | POA: Insufficient documentation

## 2023-04-12 DIAGNOSIS — F1090 Alcohol use, unspecified, uncomplicated: Secondary | ICD-10-CM | POA: Diagnosis not present

## 2023-04-12 DIAGNOSIS — R030 Elevated blood-pressure reading, without diagnosis of hypertension: Secondary | ICD-10-CM

## 2023-04-12 DIAGNOSIS — D0511 Intraductal carcinoma in situ of right breast: Secondary | ICD-10-CM | POA: Insufficient documentation

## 2023-04-12 DIAGNOSIS — Z01818 Encounter for other preprocedural examination: Secondary | ICD-10-CM | POA: Insufficient documentation

## 2023-04-12 LAB — CBC WITH DIFFERENTIAL/PLATELET
Abs Immature Granulocytes: 0.02 10*3/uL (ref 0.00–0.07)
Basophils Absolute: 0 10*3/uL (ref 0.0–0.1)
Basophils Relative: 1 %
Eosinophils Absolute: 0.7 10*3/uL — ABNORMAL HIGH (ref 0.0–0.5)
Eosinophils Relative: 11 %
HCT: 43.2 % (ref 36.0–46.0)
Hemoglobin: 14.1 g/dL (ref 12.0–15.0)
Immature Granulocytes: 0 %
Lymphocytes Relative: 31 %
Lymphs Abs: 2 10*3/uL (ref 0.7–4.0)
MCH: 30.3 pg (ref 26.0–34.0)
MCHC: 32.6 g/dL (ref 30.0–36.0)
MCV: 92.7 fL (ref 80.0–100.0)
Monocytes Absolute: 0.5 10*3/uL (ref 0.1–1.0)
Monocytes Relative: 7 %
Neutro Abs: 3.2 10*3/uL (ref 1.7–7.7)
Neutrophils Relative %: 50 %
Platelets: 152 10*3/uL (ref 150–400)
RBC: 4.66 MIL/uL (ref 3.87–5.11)
RDW: 13.2 % (ref 11.5–15.5)
WBC: 6.4 10*3/uL (ref 4.0–10.5)
nRBC: 0 % (ref 0.0–0.2)

## 2023-04-12 LAB — COMPREHENSIVE METABOLIC PANEL
ALT: 18 U/L (ref 0–44)
AST: 27 U/L (ref 15–41)
Albumin: 4.4 g/dL (ref 3.5–5.0)
Alkaline Phosphatase: 76 U/L (ref 38–126)
Anion gap: 11 (ref 5–15)
BUN: 14 mg/dL (ref 8–23)
CO2: 27 mmol/L (ref 22–32)
Calcium: 9.4 mg/dL (ref 8.9–10.3)
Chloride: 100 mmol/L (ref 98–111)
Creatinine, Ser: 0.71 mg/dL (ref 0.44–1.00)
GFR, Estimated: >60 mL/min (ref >60)
Glucose, Bld: 93 mg/dL (ref 70–99)
Potassium: 3.8 mmol/L (ref 3.5–5.1)
Sodium: 138 mmol/L (ref 135–145)
Total Bilirubin: 1.1 mg/dL (ref 0.3–1.2)
Total Protein: 7.4 g/dL (ref 6.5–8.1)

## 2023-04-17 MED ORDER — CHLORHEXIDINE GLUCONATE CLOTH 2 % EX PADS: 6.0000 | MEDICATED_PAD | Freq: Once | CUTANEOUS | Status: DC

## 2023-04-17 MED ORDER — BUPIVACAINE LIPOSOME 1.3 % IJ SUSP: 20.0000 mL | Freq: Once | INTRAMUSCULAR | Status: DC

## 2023-04-17 MED ORDER — LACTATED RINGERS IV SOLN: INTRAVENOUS | Status: DC

## 2023-04-17 MED ORDER — CELECOXIB 200 MG PO CAPS
200.0000 mg | ORAL_CAPSULE | ORAL | Status: AC
  Administered 2023-04-18: 200 mg via ORAL

## 2023-04-17 MED ORDER — CHLORHEXIDINE GLUCONATE 0.12 % MT SOLN
15.0000 mL | Freq: Once | OROMUCOSAL | Status: AC
  Administered 2023-04-18: 15 mL via OROMUCOSAL

## 2023-04-17 MED ORDER — ORAL CARE MOUTH RINSE: 15.0000 mL | Freq: Once | OROMUCOSAL | Status: AC

## 2023-04-17 MED ORDER — FAMOTIDINE 20 MG PO TABS
20.0000 mg | ORAL_TABLET | Freq: Once | ORAL | Status: AC
  Administered 2023-04-18: 20 mg via ORAL

## 2023-04-17 MED ORDER — GABAPENTIN 300 MG PO CAPS
300.0000 mg | ORAL_CAPSULE | ORAL | Status: AC
  Administered 2023-04-18: 300 mg via ORAL

## 2023-04-17 MED ORDER — ACETAMINOPHEN 500 MG PO TABS
1000.0000 mg | ORAL_TABLET | ORAL | Status: AC
  Administered 2023-04-18: 1000 mg via ORAL

## 2023-04-18 ENCOUNTER — Ambulatory Visit: Payer: Medicare PPO | Admitting: Anesthesiology

## 2023-04-18 ENCOUNTER — Ambulatory Visit
Admission: RE | Admit: 2023-04-18 | Discharge: 2023-04-18 | Disposition: A | Discharge status: Home / Self Care | Payer: Medicare PPO | Attending: Surgery | Admitting: Surgery

## 2023-04-18 ENCOUNTER — Ambulatory Visit: Payer: Medicare PPO | Admitting: Urgent Care

## 2023-04-18 ENCOUNTER — Other Ambulatory Visit: Payer: Medicare PPO

## 2023-04-18 ENCOUNTER — Other Ambulatory Visit: Payer: Self-pay

## 2023-04-18 ENCOUNTER — Encounter
Admission: RE | Disposition: A | Discharge status: Home / Self Care | Payer: Self-pay | Source: Home / Self Care | Attending: Surgery

## 2023-04-18 ENCOUNTER — Encounter: Payer: Self-pay | Admitting: Surgery

## 2023-04-18 ENCOUNTER — Ambulatory Visit
Admission: RE | Admit: 2023-04-18 | Discharge: 2023-04-18 | Disposition: A | Discharge status: Home / Self Care | Payer: Medicare PPO | Source: Ambulatory Visit | Attending: Surgery | Admitting: Surgery

## 2023-04-18 DIAGNOSIS — J449 Chronic obstructive pulmonary disease, unspecified: Secondary | ICD-10-CM | POA: Diagnosis not present

## 2023-04-18 DIAGNOSIS — F32A Depression, unspecified: Secondary | ICD-10-CM | POA: Insufficient documentation

## 2023-04-18 DIAGNOSIS — F17210 Nicotine dependence, cigarettes, uncomplicated: Secondary | ICD-10-CM | POA: Insufficient documentation

## 2023-04-18 DIAGNOSIS — D0511 Intraductal carcinoma in situ of right breast: Secondary | ICD-10-CM

## 2023-04-18 DIAGNOSIS — N6081 Other benign mammary dysplasias of right breast: Secondary | ICD-10-CM | POA: Diagnosis not present

## 2023-04-18 DIAGNOSIS — I10 Essential (primary) hypertension: Secondary | ICD-10-CM | POA: Diagnosis not present

## 2023-04-18 DIAGNOSIS — Z803 Family history of malignant neoplasm of breast: Secondary | ICD-10-CM | POA: Insufficient documentation

## 2023-04-18 DIAGNOSIS — R928 Other abnormal and inconclusive findings on diagnostic imaging of breast: Secondary | ICD-10-CM

## 2023-04-18 HISTORY — PX: BREAST LUMPECTOMY WITH RADIOFREQUENCY TAG IDENTIFICATION: SHX6884

## 2023-04-18 SURGERY — BREAST LUMPECTOMY WITH RADIOFREQUENCY TAG IDENTIFICATION
Anesthesia: General | Laterality: Right

## 2023-04-18 MED ORDER — ACETAMINOPHEN 500 MG PO TABS
ORAL_TABLET | ORAL | Status: AC
  Filled 2023-04-18: qty 2

## 2023-04-18 MED ORDER — FENTANYL CITRATE (PF) 100 MCG/2ML IJ SOLN
INTRAMUSCULAR | Status: AC
  Filled 2023-04-18: qty 2

## 2023-04-18 MED ORDER — LIDOCAINE HCL (PF) 2 % IJ SOLN
INTRAMUSCULAR | Status: AC
  Filled 2023-04-18: qty 5

## 2023-04-18 MED ORDER — CELECOXIB 200 MG PO CAPS
ORAL_CAPSULE | ORAL | Status: AC
  Filled 2023-04-18: qty 1

## 2023-04-18 MED ORDER — BUPIVACAINE HCL (PF) 0.25 % IJ SOLN
INTRAMUSCULAR | Status: AC
  Filled 2023-04-18: qty 30

## 2023-04-18 MED ORDER — ONDANSETRON HCL 4 MG/2ML IJ SOLN
INTRAMUSCULAR | Status: DC | PRN
  Administered 2023-04-18: 4 mg via INTRAVENOUS

## 2023-04-18 MED ORDER — CEFAZOLIN SODIUM-DEXTROSE 2-4 GM/100ML-% IV SOLN
INTRAVENOUS | Status: AC
  Filled 2023-04-18: qty 100

## 2023-04-18 MED ORDER — LIDOCAINE HCL (CARDIAC) PF 100 MG/5ML IV SOSY
PREFILLED_SYRINGE | INTRAVENOUS | Status: DC | PRN
  Administered 2023-04-18: 50 mg via INTRAVENOUS

## 2023-04-18 MED ORDER — DEXAMETHASONE SODIUM PHOSPHATE 10 MG/ML IJ SOLN
INTRAMUSCULAR | Status: DC | PRN
  Administered 2023-04-18: 10 mg via INTRAVENOUS

## 2023-04-18 MED ORDER — EPINEPHRINE PF 1 MG/ML IJ SOLN
INTRAMUSCULAR | Status: AC
  Filled 2023-04-18: qty 1

## 2023-04-18 MED ORDER — STERILE WATER FOR IRRIGATION IR SOLN
Status: DC | PRN
  Administered 2023-04-18: 500 mL

## 2023-04-18 MED ORDER — GABAPENTIN 300 MG PO CAPS
ORAL_CAPSULE | ORAL | Status: AC
  Filled 2023-04-18: qty 1

## 2023-04-18 MED ORDER — PROPOFOL 10 MG/ML IV BOLUS
INTRAVENOUS | Status: DC | PRN
  Administered 2023-04-18: 100 mg via INTRAVENOUS
  Administered 2023-04-18: 50 mg via INTRAVENOUS

## 2023-04-18 MED ORDER — OXYCODONE HCL 5 MG PO TABS
ORAL_TABLET | ORAL | Status: AC
  Filled 2023-04-18: qty 1

## 2023-04-18 MED ORDER — FENTANYL CITRATE (PF) 100 MCG/2ML IJ SOLN
INTRAMUSCULAR | Status: DC | PRN
  Administered 2023-04-18: 50 ug via INTRAVENOUS

## 2023-04-18 MED ORDER — PROMETHAZINE HCL 25 MG/ML IJ SOLN: 6.2500 mg | INTRAMUSCULAR | Status: DC | PRN

## 2023-04-18 MED ORDER — OXYCODONE HCL 5 MG/5ML PO SOLN: 5.0000 mg | Freq: Once | ORAL | Status: AC | PRN

## 2023-04-18 MED ORDER — MIDAZOLAM HCL 2 MG/2ML IJ SOLN
INTRAMUSCULAR | Status: AC
  Filled 2023-04-18: qty 2

## 2023-04-18 MED ORDER — CHLORHEXIDINE GLUCONATE 0.12 % MT SOLN
OROMUCOSAL | Status: AC
  Filled 2023-04-18: qty 15

## 2023-04-18 MED ORDER — FENTANYL CITRATE (PF) 100 MCG/2ML IJ SOLN: 25.0000 ug | INTRAMUSCULAR | Status: DC | PRN

## 2023-04-18 MED ORDER — ONDANSETRON HCL 4 MG/2ML IJ SOLN
INTRAMUSCULAR | Status: AC
  Filled 2023-04-18: qty 2

## 2023-04-18 MED ORDER — DEXAMETHASONE SODIUM PHOSPHATE 10 MG/ML IJ SOLN
INTRAMUSCULAR | Status: AC
  Filled 2023-04-18: qty 1

## 2023-04-18 MED ORDER — BUPIVACAINE-EPINEPHRINE (PF) 0.25% -1:200000 IJ SOLN
INTRAMUSCULAR | Status: DC | PRN
  Administered 2023-04-18: 25 mL

## 2023-04-18 MED ORDER — ACETAMINOPHEN 10 MG/ML IV SOLN: 1000.0000 mg | Freq: Once | INTRAVENOUS | Status: DC | PRN

## 2023-04-18 MED ORDER — OXYCODONE HCL 5 MG PO TABS
5.0000 mg | ORAL_TABLET | Freq: Once | ORAL | Status: AC | PRN
  Administered 2023-04-18: 5 mg via ORAL

## 2023-04-18 MED ORDER — MIDAZOLAM HCL 2 MG/2ML IJ SOLN
INTRAMUSCULAR | Status: DC | PRN
  Administered 2023-04-18: 2 mg via INTRAVENOUS

## 2023-04-18 MED ORDER — HYDROCODONE-ACETAMINOPHEN 5-325 MG PO TABS: 1.0000 | ORAL_TABLET | Freq: Four times a day (QID) | ORAL | 0 refills | Status: DC | PRN

## 2023-04-18 MED ORDER — DROPERIDOL 2.5 MG/ML IJ SOLN: 0.6250 mg | Freq: Once | INTRAMUSCULAR | Status: DC | PRN

## 2023-04-18 MED ORDER — FAMOTIDINE 20 MG PO TABS
ORAL_TABLET | ORAL | Status: AC
  Filled 2023-04-18: qty 1

## 2023-04-18 MED ORDER — ACETAMINOPHEN 500 MG PO TABS
ORAL_TABLET | ORAL | Status: AC
  Filled 2023-04-18: qty 1

## 2023-04-18 MED ORDER — PROPOFOL 10 MG/ML IV BOLUS
INTRAVENOUS | Status: AC
  Filled 2023-04-18: qty 20

## 2023-04-18 SURGICAL SUPPLY — 43 items
ADH SKN CLS APL DERMABOND .7 (GAUZE/BANDAGES/DRESSINGS) ×1
APL PRP STRL LF DISP 70% ISPRP (MISCELLANEOUS) ×1
APPLIER CLIP 9.375 SM OPEN (CLIP)
APR CLP SM 9.3 20 MLT OPN (CLIP)
BLADE SURG 15 STRL LF DISP TIS (BLADE) ×1 IMPLANT
BLADE SURG 15 STRL SS (BLADE) ×1
CHLORAPREP W/TINT 26 (MISCELLANEOUS) ×1 IMPLANT
CLIP APPLIE 9.375 SM OPEN (CLIP) IMPLANT
CNTNR URN SCR LID CUP LEK RST (MISCELLANEOUS) IMPLANT
CONT SPEC 4OZ STRL OR WHT (MISCELLANEOUS)
COVER PROBE GAMMA FINDER SLV (MISCELLANEOUS) ×1 IMPLANT
DERMABOND ADVANCED .7 DNX12 (GAUZE/BANDAGES/DRESSINGS) ×1 IMPLANT
DEVICE DUBIN SPECIMEN MAMMOGRA (MISCELLANEOUS) ×1 IMPLANT
DRAPE LAPAROTOMY TRNSV 106X77 (MISCELLANEOUS) ×1 IMPLANT
ELECT CAUTERY BLADE TIP 2.5 (TIP) ×1
ELECT REM PT RETURN 9FT ADLT (ELECTROSURGICAL) ×1
ELECTRODE CAUTERY BLDE TIP 2.5 (TIP) ×1 IMPLANT
ELECTRODE REM PT RTRN 9FT ADLT (ELECTROSURGICAL) ×1 IMPLANT
GAUZE 4X4 16PLY ~~LOC~~+RFID DBL (SPONGE) ×1 IMPLANT
GLOVE ORTHO TXT STRL SZ7.5 (GLOVE) ×1 IMPLANT
GOWN STRL REUS W/ TWL LRG LVL3 (GOWN DISPOSABLE) ×1 IMPLANT
GOWN STRL REUS W/ TWL XL LVL3 (GOWN DISPOSABLE) ×1 IMPLANT
GOWN STRL REUS W/TWL LRG LVL3 (GOWN DISPOSABLE) ×1
GOWN STRL REUS W/TWL XL LVL3 (GOWN DISPOSABLE) ×1
KIT MARKER MARGIN INK (KITS) IMPLANT
KIT TURNOVER KIT A (KITS) ×1 IMPLANT
MANIFOLD NEPTUNE II (INSTRUMENTS) ×1 IMPLANT
NDL HYPO 22X1.5 SAFETY MO (MISCELLANEOUS) ×1 IMPLANT
NEEDLE HYPO 22X1.5 SAFETY MO (MISCELLANEOUS) ×1 IMPLANT
PACK BASIN MINOR ARMC (MISCELLANEOUS) ×1 IMPLANT
SET LOCALIZER 20 PROBE US (MISCELLANEOUS) ×1 IMPLANT
SPIKE FLUID TRANSFER (MISCELLANEOUS) ×1 IMPLANT
SUT MNCRL 4-0 (SUTURE) ×1
SUT MNCRL 4-0 27XMFL (SUTURE) ×1
SUT VIC AB 3-0 SH 27 (SUTURE) ×1
SUT VIC AB 3-0 SH 27X BRD (SUTURE) ×1 IMPLANT
SUTURE MNCRL 4-0 27XMF (SUTURE) ×1 IMPLANT
SYR 10ML LL (SYRINGE) ×1 IMPLANT
SYR 20ML LL LF (SYRINGE) ×1 IMPLANT
TRAP FLUID SMOKE EVACUATOR (MISCELLANEOUS) ×1 IMPLANT
TRAP NEPTUNE SPECIMEN COLLECT (MISCELLANEOUS) ×1 IMPLANT
WATER STERILE IRR 1000ML POUR (IV SOLUTION) ×1 IMPLANT
WATER STERILE IRR 500ML POUR (IV SOLUTION) ×1 IMPLANT

## 2023-04-18 NOTE — H&P (Signed)
Chief Complaint: DCIS right breast   History of Present Illness The additional 2 site bx was completed, leaving only one site for DCIS, will then proceed with breast conservation for DCIS, as no other areas in the right breast need any further evaluation.   Her MRI shows:   CLINICAL DATA:  RIGHT breast DCIS.   EXAM: BILATERAL BREAST MRI WITH AND WITHOUT CONTRAST   TECHNIQUE: Multiplanar, multisequence MR images of both breasts were obtained prior to and following the intravenous administration of 5 ml of Gadavist   Three-dimensional MR images were rendered by post-processing of the original MR data on an independent workstation. The three-dimensional MR images were interpreted, and findings are reported in the following complete MRI report for this study. Three dimensional images were evaluated at the independent interpreting workstation using the DynaCAD thin client.   COMPARISON:  Previous exams including RIGHT breast diagnostic mammogram dated 02/20/2023.   FINDINGS: Breast composition: c. Heterogeneous fibroglandular tissue.   Background parenchymal enhancement: Mild   Right breast: Biopsy site marker within the upper outer quadrant of the RIGHT breast. Focal non-mass enhancement surrounding the biopsy clip artifact, measuring up to 11 mm greatest dimension, compatible with the biopsy-proven DCIS and likely accentuated by post biopsy change (series 12, image 45).   No additional suspicious enhancing mass, suspicious non-mass enhancement or secondary signs of malignancy elsewhere within the RIGHT breast.   Left breast: No suspicious enhancing mass, suspicious non-mass enhancement or secondary signs of malignancy in the LEFT breast.   Lymph nodes: No abnormal appearing lymph nodes.   Ancillary findings:  None.   IMPRESSION: 1. Biopsy-proven DCIS within the upper-outer quadrant of the RIGHT breast, with associated biopsy clip artifact, with associated  focal non-mass enhancement which measures up to 11 mm greatest dimension but which is likely accentuated in size by post biopsy change. The biopsied calcifications which revealed DCIS measured 4 mm extent. 2. No evidence of multifocal or multicentric disease in the RIGHT breast. 3. No evidence of contralateral disease in the LEFT breast.   RECOMMENDATION: 1. If breast conservation surgery is being considered, an additional 2-site stereotactic biopsy was recommended for additional indeterminate calcifications in the RIGHT breast as outlined on diagnostic report of 02/20/2023. 2. Otherwise, per treatment plan for patient's biopsy-proven DCIS in the RIGHT breast.   BI-RADS CATEGORY  6: Known biopsy-proven malignancy.     Electronically Signed   By: Bary Richard M.D.   On: 03/13/2023 14:50     Sandra Franco is a 72 y.o. female with an abnormal screening mammogram, new area of linear branching microcalcifications noted, stereotactic guided core biopsy reveals DCIS.  2 other areas of known stable calcifications, previously biopsied 2013 as benign. She has a remote history of birth control use and hormonal therapy.  She has a maternal aunt and a maternal cousin with breast cancer.  She began menstruating at the age of 67 she is gravida 2 para 1 with 1 abortion.  She is 25 at the time of her first pregnancy.  She denies any breast pain, palpable masses, skin changes, nipple discharge. She has not had good experiences with either of her stereotactic biopsies, and cringes at the idea of repeating this 2 more times.  At the same time she would like to avoid mastectomy.   Past Medical History     Past Medical History:  Diagnosis Date   Depression     Heavy alcohol consumption     Mood disorder     Osteopenia  Tobacco use disorder     Tubular adenoma of colon 2019    needs another colonoscopy in 3 years             Past Surgical History:  Procedure Laterality Date   BREAST BIOPSY  Right 2014    NEG   BREAST BIOPSY Right 02/27/2023    Right Breast Bx, Ribbon clip - path pending   BREAST BIOPSY Right 02/27/2023    MM RT BREAST BX W LOC DEV 1ST LESION IMAGE BX SPEC STEREO GUIDE 02/27/2023 [facility]   COLONOSCOPY   09/23/2012    negative   COLONOSCOPY WITH PROPOFOL N/A 08/09/2018    Procedure: COLONOSCOPY WITH PROPOFOL;  Surgeon: Scot Jun, MD;  Location: [facility] END[facility]COPY;  Service: Endoscopy;  Laterality: N/A;   FINGER FRACTURE SURGERY Right 1984   INDUCED ABORTION       TONSILLECTOMY              Allergies  Allergen Reactions   Amoxicillin Rash   Cefdinir Rash   Codeine Nausea Only            Current Outpatient Medications  Medication Sig Dispense Refill   buPROPion (WELLBUTRIN XL) 300 MG 24 hr tablet Take 1 tablet (300 mg total) by mouth daily. 90 tablet 0   Calcium Citrate-Vitamin D (CALCIUM + D PO) Take 1 capsule by mouth daily.       ibuprofen (ADVIL) 200 MG tablet Take 200 mg by mouth every 6 (six) hours as needed.       Multiple Vitamin (MULTIVITAMIN) capsule Take 1 capsule by mouth daily.       rosuvastatin (CRESTOR) 5 MG tablet Take 1 tablet (5 mg total) by mouth daily. 90 tablet 3    No current facility-administered medications for this visit.      Family History      Family History  Problem Relation Age of Onset   Rheum arthritis Mother     Hypertension Mother     Lung cancer Father 45   Pancreatic cancer Maternal Aunt 44   Breast cancer Neg Hx          Social History Social History         Tobacco Use   Smoking status: Every Day      Packs/day: 0.50      Years: 51.00      Additional pack years: 0.00      Total pack years: 25.50      Types: Cigarettes   Smokeless tobacco: Never  Vaping Use   Vaping Use: Never used  Substance Use Topics   Alcohol use: Yes      Alcohol/week: 14.0 - 21.0 standard drinks of alcohol      Types: 14 - 21 Cans of beer per week      Comment: 2-3   Drug use: No          Review of  Systems  Constitutional: Negative.   HENT: Negative.    Eyes: Negative.   Respiratory:  Positive for cough and sputum production.   Cardiovascular: Negative.   Gastrointestinal: Negative.   Genitourinary: Negative.   Skin: Negative.   Neurological: Negative.   Psychiatric/Behavioral:  Positive for depression.         Physical Exam Blood pressure (!) 153/90, pulse 87, temperature 98.1 F (36.7 C), temperature source Oral, height 5\' 5"  (1.651 m), weight 114 lb (51.7 kg), SpO2 98 %. Last Weight  Most recent update: 03/15/2023  9:20 AM  Weight  51.7 kg (114 lb)                     CONSTITUTIONAL: Well developed, and nourished, appropriately responsive and aware without distress.   EYES: Sclera non-icteric.   EARS, NOSE, MOUTH AND THROAT:  The oropharynx is clear. Oral mucosa is pink and moist.   Hearing is intact to voice.  NECK: Trachea is midline, and there is no jugular venous distension.  LYMPH NODES:  Lymph nodes in the neck are not appreciated. RESPIRATORY:  Lungs are clear, and breath sounds are equal bilaterally.  Normal respiratory effort without pathologic use of accessory muscles. CARDIOVASCULAR: Heart is regular in rate and rhythm.  Well perfused.  GI: The abdomen is  soft, nontender, and nondistended. GU: Marylene Land present as chaperone, palpable changes involving the biopsy site with evidence of ecchymosis, clearly nondiagnostic. MUSCULOSKELETAL:  Symmetrical muscle tone appreciated in all four extremities.    SKIN: Skin turgor is normal. No pathologic skin lesions appreciated.  NEUROLOGIC:  Motor and sensation appear grossly normal.  Cranial nerves are grossly without defect. PSYCH:  Alert and oriented to person, place and time. Affect is appropriate for situation.   Data Reviewed I have personally reviewed what is currently available of the patient's imaging, recent labs and medical records.   Labs:      Latest Ref Rng & Units 12/26/2021   11:31 AM 01/25/2019    10:57 AM 10/02/2018    4:18 PM  CBC  WBC 3.4 - 10.8 x10E3/uL 6.6  6.0  6.5   Hemoglobin 11.1 - 15.9 g/dL 16.1  09.6  04.5   Hematocrit 34.0 - 46.6 % 43.9  42.3  44.8   Platelets 150 - 450 x10E3/uL 146  240  114         Latest Ref Rng & Units 12/26/2021   11:31 AM 10/02/2018    4:18 PM  CMP  Glucose 70 - 99 mg/dL 84  74   BUN 8 - 27 mg/dL 11  8   Creatinine 4.09 - 1.00 mg/dL 8.11  9.14   Sodium 782 - 144 mmol/L 139  141   Potassium 3.5 - 5.2 mmol/L 4.4  5.1   Chloride 96 - 106 mmol/L 100  98   CO2 20 - 29 mmol/L 26  26   Calcium 8.7 - 10.3 mg/dL 95.6  21.3   Total Protein 6.0 - 8.5 g/dL 7.3  7.2   Total Bilirubin 0.0 - 1.2 mg/dL 0.5  0.5   Alkaline Phos 44 - 121 IU/L 86  81   AST 0 - 40 IU/L 26  22   ALT 0 - 32 IU/L 22  16     SURGICAL PATHOLOGY CASE: [id number] PATIENT: Sandra Franco Surgical Pathology Report  Specimen Submitted: A. Breast, right outer  Clinical History: Linear branching calcifications in right outer breast middle depth (ribbon)  DIAGNOSIS: A.  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 - DCIS IS PRESENT IN 4 OF 5 BLOCKS.  Comment: ER testing is deferred to excision specimen.  GROSS DESCRIPTION: A. Labeled: Right breast stereo biopsy calcs outer middle depth Received: in a formalin-filled [employer] collection device Specimen radiograph image(s) available for review Time/Date in fixative: Collected at 8:57 AM on 02/27/2023 and placed in formalin at 8:58 AM on 02/27/2023 Cold ischemic time: Less than 5 minutes Total fixation time: Approximately 8.25 hours Core pieces: Multiple Measurement: Aggregate,  8 x 0.8 x 0.2 cm Description / comments: Received are cores and fragments of yellow fibrofatty tissue.  The accompanying diagram has sections C-E checked. Inked: Blue Entirely submitted in cassette(s):  1 - section C 2 - section D 3 -  section E 4 - 5 - remaining tissue fragments      4 - sections A and B      5 - sections F and G with remaining free-floating fragments  RB 02/27/2023 Final Diagnosis performed by Elijah Birk, MD.   Electronically signed 02/28/2023 9:07:25AM    SURGICAL PATHOLOGY CASE: [id number] PATIENT: Sandra Franco Surgical Pathology Report     Specimen Submitted: A. Breast, right, UOQ posterior B. Breast, right, UOQ anterior  Clinical History: Recently diagnosed right breast DCIS. Amorphous calcifications in right breast. A: right upper outer quadrant posterior depth (coil); B: right upper outer quadrant anterior depth (X clip)      DIAGNOSIS: A.  BREAST CALCIFICATIONS, RIGHT UPPER OUTER QUADRANT POSTERIOR DEPTH; STEREOTACTIC BIOPSY: - BENIGN MAMMARY GLANDULAR TISSUE WITH INVOLUTIONAL CHANGE, FOCAL COLUMNAR CELL CHANGE, AND CALCIFICATIONS ASSOCIATED WITH FIBROUS STROMA. - NEGATIVE FOR ATYPIA AND MALIGNANCY. - DEEPER SECTIONS EXAMINED.  B.  BREAST CALCIFICATIONS, RIGHT UPPER OUTER QUADRANT ANTERIOR DEPTH; STEREOTACTIC BIOPSY: - BENIGN MAMMARY GLANDULAR TISSUE WITH INVOLUTIONAL CHANGE, FOCAL COLUMNAR CELL CHANGE WITH USUAL EPITHELIAL HYPERPLASIA, AND CALCIFICATIONS ASSOCIATED WITH FIBROUS STROMA. - FOCAL FAT NECROSIS. - NEGATIVE FOR ATYPIA AND MALIGNANCY. - DEEPER SECTIONS EXAMINED.  Imaging: Radiological images reviewed:  CLINICAL DATA:  The patient was called back for right breast calcifications.   EXAM: DIGITAL DIAGNOSTIC UNILATERAL RIGHT MAMMOGRAM WITH TOMOSYNTHESIS   TECHNIQUE: Right digital diagnostic mammography and breast tomosynthesis was performed.   COMPARISON:  Previous exam(s).   ACR Breast Density Category c: The breasts are heterogeneously dense, which may obscure small masses.   FINDINGS: Linear branching calcifications are identified in the upper outer right breast spanning 3.8 mm. Calcifications are identified anterior and superior to the new  linear branching calcifications. The majority of the remaining calcifications are stable. The anterior aspect of calcifications biopsied in the past and benign.   IMPRESSION: New linear branching calcifications in the upper outer right breast. Relatively stable calcifications previously biopsied anteriorly and benign, anterior and posterior to the linear branching calcifications.   RECOMMENDATION: Recommend stereotactic biopsy of the new linear branching calcifications. If the biopsy demonstrates malignancy and lumpectomy is desired, recommend biopsying the anterior and posterior extent of remaining calcifications.   I have discussed the findings and recommendations with the patient. If applicable, a reminder letter will be sent to the patient regarding the next appointment.   BI-RADS CATEGORY  4: Suspicious.     Electronically Signed   By: Gerome Sam III M.D.   On: 02/20/2023 16:20   Within last 24 hrs: No results found.   Assessment   Assessment  DCIS right breast, additional right breast calcifications warranting further evaluation per radiology.     Patient Active Problem List    Diagnosis Date Noted   COVID-19 11/23/2022   Elevated blood-pressure reading without diagnosis of hypertension 12/26/2021   Screening mammogram, encounter for 12/26/2020   Thrombocytopenia 01/20/2019   Tobacco use disorder 10/04/2018   Heavy alcohol consumption 04/23/2018   Depression 04/23/2018   Osteopenia        Plan     Plan  RFID tagged right breast lumpectomy.  We can now proceed with breast conservation in good conscience because the other areas of concern proved benign. She understands  radiation is the essential component to minimizing her risk of in breast tumor recurrence, and despite successful breast conservation may require mastectomy down the road regardless.  However we wish to avoid I BTR, and at present she wishes to avoid mastectomy.     These notes generated  with voice recognition software. I apologize for typographical errors.

## 2023-04-18 NOTE — Anesthesia Procedure Notes (Addendum)
Procedure Name: LMA Insertion Date/Time: 04/18/2023 2:04 PM  Performed by: Jeannene Patella, CRNAPre-anesthesia Checklist: Patient identified, Timeout performed, Emergency Drugs available, Suction available and Patient being monitored Patient Re-evaluated:Patient Re-evaluated prior to induction Oxygen Delivery Method: Circle system utilized Preoxygenation: Pre-oxygenation with 100% oxygen Induction Type: IV induction LMA: LMA inserted LMA Size: 4.0 Tube type: Oral Number of attempts: 1 Placement Confirmation: positive ETCO2 and breath sounds checked- equal and bilateral Tube secured with: Tape Dental Injury: Teeth and Oropharynx as per pre-operative assessment  Comments: Soft gauze roll left molars

## 2023-04-18 NOTE — Interval H&P Note (Signed)
History and Physical Interval Note:  04/18/2023 1:35 PM  Sandra Franco  has presented today for surgery, with the diagnosis of DCIS.  The various methods of treatment have been discussed with the patient and family. After consideration of risks, benefits and other options for treatment, the patient has consented to  Procedure(s): BREAST LUMPECTOMY WITH RADIOFREQUENCY TAG IDENTIFICATION (Right) as a surgical intervention.  The patient's history has been reviewed, patient examined, no change in status, stable for surgery.  I have reviewed the patient's chart and labs.  Questions were answered to the patient's satisfaction.     Campbell Lerner

## 2023-04-18 NOTE — Op Note (Signed)
  Pre-operative Diagnosis: DCIS right breast.    Post-operative Diagnosis: Same  Surgeon: Campbell Lerner, M.D., [facility]  Anesthesia: Gen  Procedure: Right Lumpectomy, RFID tag directed.   Procedure Details  The patient was seen again in the Holding Room. The benefits, complications, treatment options, and expected outcomes were discussed with the patient. The risks of bleeding, infection, recurrence of symptoms, failure to resolve symptoms, hematoma, seroma, open wound, cosmetic deformity, and the need for further surgery were discussed.  The patient was taken to Operating Room, identified as Sandra Franco and the procedure verified.  A Time Out was held and the above information confirmed.  Prior to the induction of general anesthesia, antibiotic prophylaxis was administered. VTE prophylaxis was in place. The patient was positioned in the supine position. Appropriate anesthesia was then administered and tolerated well. The LOCALizer is used to mark the skin for incision.  The chest was prepped with Chloraprep and draped in the sterile fashion.   Attention was turned to the RFID tag localization site where an incision was made. Dissection using the LOCALizer to perform a lumpectomy with adequate margins was performed. This was done with electrocautery and sharp dissection with Mayo scissors. There was minimal bleeding, and the cavity packed.  The specimen was taken to the back table and painted to demarcate the 6 surfaces of potential margin.  I obtain shave margins in addition from the lateral/superior aspect.  I returned to the cavity to remove the packing, and hemostasis was confirmed with electrocautery.   Once assuring that hemostasis was adequate and checked multiple times the wound was closed with interrupted 3-0 Vicryl followed by 4-0 subcuticular Monocryl sutures. Dermabond is utilized to seal the incision.    Findings: Faxitron imaging: markers in specimen  Estimated Blood Loss: Minimal          Drains: None         Specimens:  Right UOQ lumpectomy, and shave margins of lateral/superior aspect.        Complications: none.          Condition: Stable  Campbell Lerner, M.D., [facility]  04/18/2023 ; 3:15 PM

## 2023-04-18 NOTE — Discharge Instructions (Signed)
AMBULATORY SURGERY  ?DISCHARGE INSTRUCTIONS ? ? ?The drugs that you were given will stay in your system until tomorrow so for the next 24 hours you should not: ? ?Drive an automobile ?Make any legal decisions ?Drink any alcoholic beverage ? ? ?You may resume regular meals tomorrow.  Today it is better to start with liquids and gradually work up to solid foods. ? ?You may eat anything you prefer, but it is better to start with liquids, then soup and crackers, and gradually work up to solid foods. ? ? ?Please notify your doctor immediately if you have any unusual bleeding, trouble breathing, redness and pain at the surgery site, drainage, fever, or pain not relieved by medication. ? ? ? ?Additional Instructions: ? ? ? ?Please contact your physician with any problems or Same Day Surgery at [phone], Monday through Friday 6 am to 4 pm, or [facility] Main number at [phone].  ?

## 2023-04-18 NOTE — Transfer of Care (Signed)
Immediate Anesthesia Transfer of Care Note  Patient: Sandra Franco  Procedure(s) Performed: BREAST LUMPECTOMY WITH RADIOFREQUENCY TAG IDENTIFICATION (Right)  Patient Location: PACU  Anesthesia Type:General  Level of Consciousness: awake, alert , and oriented  Airway & Oxygen Therapy: Patient Spontanous Breathing  Post-op Assessment: Report given to RN and Post -op Vital signs reviewed and stable  Post vital signs: Reviewed and stable  Last Vitals:  Vitals Value Taken Time  BP 137/77 04/18/23 1503  Temp 36.3 C 04/18/23 1503  Pulse 72 04/18/23 1505  Resp 12 04/18/23 1505  SpO2 97 % 04/18/23 1505  Vitals shown include unvalidated device data.  Last Pain:  Vitals:   04/18/23 1503  TempSrc:   PainSc: Asleep         Complications: No notable events documented.

## 2023-04-18 NOTE — Anesthesia Preprocedure Evaluation (Addendum)
Anesthesia Evaluation  Patient identified by MRN, date of birth, ID band Patient awake    Reviewed: Allergy & Precautions, H&P , NPO status , Patient's Chart, lab work & pertinent test results  Airway Mallampati: III  TM Distance: >3 FB Neck ROM: full    Dental  (+) Partial Lower, Partial Upper   Pulmonary COPD, Current Smoker and Patient abstained from smoking.   Pulmonary exam normal        Cardiovascular Exercise Tolerance: Good negative cardio ROS Normal cardiovascular exam     Neuro/Psych  PSYCHIATRIC DISORDERS  Depression    negative neurological ROS     GI/Hepatic negative GI ROS, Neg liver ROS,,,  Endo/Other  negative endocrine ROS    Renal/GU      Musculoskeletal   Abdominal Normal abdominal exam  (+)   Peds  Hematology negative hematology ROS (+)   Anesthesia Other Findings Ductal carcinoma in situ (DCIS) of right breast  Past Medical History: No date: COPD (chronic obstructive pulmonary disease) (HCC) No date: COVID-19 No date: Depression No date: Heavy alcohol consumption No date: Mood disorder (HCC) No date: Osteopenia No date: Thrombocytopenia (HCC) No date: Tobacco use disorder 2019: Tubular adenoma of colon     Comment:  needs another colonoscopy in 3 years  Past Surgical History: 2014: BREAST BIOPSY; Right     Comment:  NEG 02/27/2023: BREAST BIOPSY; Right     Comment:  Right Breast Bx, Ribbon clip - path pending 02/27/2023: BREAST BIOPSY; Right     Comment:  MM RT BREAST BX W LOC DEV 1ST LESION IMAGE BX SPEC               STEREO GUIDE 02/27/2023 [facility] 03/21/2023: BREAST BIOPSY; Right     Comment:  Stereo Bx, Coil Clip - path pending 03/21/2023: BREAST BIOPSY; Right     Comment:  Stereo Bx, Ribbon Clip - path pending 03/21/2023: BREAST BIOPSY; Right     Comment:  MM RT BREAST BX W LOC DEV 1ST LESION IMAGE BX SPEC               STEREO GUIDE 03/21/2023  [facility] 03/21/2023: BREAST BIOPSY; Right     Comment:  MM RT BREAST BX W LOC DEV EA AD LESION IMG BX SPEC               STEREO GUIDE 03/21/2023 [facility] 04/05/2023: BREAST BIOPSY; Right     Comment:  MM RT RADIO FREQUENCY TAG LOC MAMMO GUIDE 04/05/2023               [facility] 09/23/2012: COLONOSCOPY     Comment:  negative 08/09/2018: COLONOSCOPY WITH PROPOFOL; N/A     Comment:  Procedure: COLONOSCOPY WITH PROPOFOL;  Surgeon: Scot Jun, MD;  Location: [facility] ENDOSCOPY;  Service:               Endoscopy;  Laterality: N/A; 1984: FINGER FRACTURE SURGERY; Right No date: INDUCED ABORTION No date: TONSILLECTOMY     Reproductive/Obstetrics negative OB ROS                              Anesthesia Physical Anesthesia Plan  ASA: 3  Anesthesia Plan: General LMA   Post-op Pain Management: Tylenol PO (pre-op)*, Celebrex PO (pre-op)*, Gabapentin PO (pre-op)* and Regional block*   Induction: Intravenous  PONV Risk Score and Plan: 2 and Dexamethasone  and Ondansetron  Airway Management Planned: LMA  Additional Equipment:   Intra-op Plan:   Post-operative Plan: Extubation in OR  Informed Consent: I have reviewed the patients History and Physical, chart, labs and discussed the procedure including the risks, benefits and alternatives for the proposed anesthesia with the patient or authorized representative who has indicated his/her understanding and acceptance.     Dental Advisory Given  Plan Discussed with: Anesthesiologist, CRNA and Surgeon  Anesthesia Plan Comments:          Anesthesia Quick Evaluation

## 2023-04-19 ENCOUNTER — Encounter: Payer: Self-pay | Admitting: Surgery

## 2023-04-19 NOTE — Anesthesia Postprocedure Evaluation (Signed)
Anesthesia Post Note  Patient: Sandra Franco  Procedure(s) Performed: BREAST LUMPECTOMY WITH RADIOFREQUENCY TAG IDENTIFICATION (Right)  Patient location during evaluation: PACU Anesthesia Type: General Level of consciousness: awake and alert, oriented and patient cooperative Pain management: pain level controlled Vital Signs Assessment: post-procedure vital signs reviewed and stable Respiratory status: spontaneous breathing, nonlabored ventilation and respiratory function stable Cardiovascular status: blood pressure returned to baseline and stable Postop Assessment: adequate PO intake Anesthetic complications: no   No notable events documented.   Last Vitals:  Vitals:   04/18/23 1530 04/18/23 1545  BP: 135/85 (!) 155/81  Pulse: 66 60  Resp: 13 18  Temp: (!) 36.3 C (!) 36.3 C  SpO2: 100% 97%    Last Pain:  Vitals:   04/18/23 1545  TempSrc: Temporal  PainSc: 2                  Reed Breech

## 2023-04-24 ENCOUNTER — Other Ambulatory Visit: Payer: Self-pay | Admitting: Pathology

## 2023-04-24 LAB — SURGICAL PATHOLOGY

## 2023-04-26 ENCOUNTER — Encounter: Payer: Self-pay | Admitting: Surgery

## 2023-04-26 ENCOUNTER — Ambulatory Visit (INDEPENDENT_AMBULATORY_CARE_PROVIDER_SITE_OTHER): Payer: Medicare PPO | Admitting: Surgery

## 2023-04-26 ENCOUNTER — Encounter: Payer: Self-pay | Admitting: *Deleted

## 2023-04-26 VITALS — BP 133/83 | HR 78 | Temp 98.0°F | Ht 65.0 in | Wt 113.0 lb

## 2023-04-26 DIAGNOSIS — Z09 Encounter for follow-up examination after completed treatment for conditions other than malignant neoplasm: Secondary | ICD-10-CM

## 2023-04-26 DIAGNOSIS — D0511 Intraductal carcinoma in situ of right breast: Secondary | ICD-10-CM

## 2023-04-26 NOTE — Progress Notes (Signed)
[facility] POST-OP OFFICE VISIT  04/26/2023  HPI: Sandra Franco is a 72 y.o. female 8 days s/p RFID tag to right breast lumpectomy for DCIS. Reports occasional stinging pain with pressure to the area, encountered during changes of positioning with sleep.  Otherwise denies any significant pain or tenderness.  No fevers, no chills, no ecchymosis.  No erythema.  Reviewed the pathology.    Vital signs: Ht 5\' 5"  (1.651 m)   Wt 113 lb (51.3 kg)   BMI 18.80 kg/m    Physical Exam: Constitutional: She appears well  Skin: Incision of the right breast has intact Dermabond.  There is no evidence of ecchymosis, hematoma or seroma.  Incision is clean dry and intact.   SURGICAL PATHOLOGY * THIS IS AN ADDENDUM REPORT * CASE: [id number] PATIENT: Sandra Franco Surgical Pathology Report *Addendum *  Reason for Addendum #1:  Breast Biomarker Results  Specimen Submitted: A. Breast, right, UOQ B. Breast, right, UOQ, lateral superior  Clinical History: DCIS  DIAGNOSIS: A.  BREAST, RIGHT UPPER OUTER QUADRANT; LUMPECTOMY: - DUCTAL CARCINOMA IN SITU (DCIS). - SEE CANCER SUMMARY BELOW. - BIOPSY SITE CHANGE AND ASSOCIATED CLIP. - RFID TAG IN PLACE. - APOCRINE METAPLASIA, COLUMNAR CELL CHANGE, FLAT EPITHELIAL ATYPIA, SCLEROSING ADENOSIS, AND USUAL DUCTAL HYPERPLASIA.  B.  BREAST, RIGHT UPPER OUTER QUADRANT LATERAL SUPERIOR; EXCISION: - COLUMNAR CELL CHANGE. - NEGATIVE FOR ATYPIA AND MALIGNANCY.  CANCER CASE SUMMARY: DUCTAL CARCINOMA IN SITU OF THE BREAST Standard(s): AJCC-UICC 8  SPECIMEN Procedure: Lumpectomy Specimen Laterality: Right  TUMOR Histologic Type: Ductal carcinoma in situ Size (Extent) of DCIS:  at least 15 mm Nuclear Grade: Grade 2 (intermediate) Necrosis: Not identified  MARGINS Margin Status: All margins negative for DCIS      Distance from DCIS to closest margin: 3 mm      Specify closest margin: Anterior  REGIONAL LYMPH NODES Regional Lymph  node Status: Not applicable (no regional lymph nodes submitted or found)  DISTANT METASTASIS Distant Site(s) Involved, if applicable (select all that apply): Not applicable  PATHOLOGIC STAGE CLASSIFICATION (pTNM, AJCC 8th Edition) TNM Descriptors: Not applicable pTis (DCIS) Regional Lymph Nodes Modifier: Not applicable pN - Not applicable (no regional lymph nodes submitted or found) pM - Not applicable  SPECIAL STUDIES Breast Biomarker Testing will be performed and reported in an addendum.  Comment: Residual DCIS is present in the lumpectomy specimen (part A) lateral and superior to the biopsy site. While DCIS focally involves the lateral margin and is close to the superior margin in part A the re-excised lateral superior tissue in part B is negative. The final margins are negative. The above AJCC cancer summary incorporates the findings from both specimens.  GROSS DESCRIPTION: Intraoperative Consultation:     Labeled: Right breast upper outer quadrant     Received: Fresh     Specimen: Breast lumpectomy     Pathologic evaluation performed: Gross margin evaluation     Diagnosis: IOC: Biopsy site; no obvious mass     Communicated to: Called to Dr. Claudine Mouton at 2:50 PM on 04/18/2023    Assessment/Plan: This is a 72 y.o. female 14 days s/p RFID tag lumpectomy for DCIS right breast.  Patient Active Problem List   Diagnosis Date Noted   Abnormal mammogram of right breast 03/15/2023   Ductal carcinoma in situ (DCIS) of right breast 03/15/2023   COVID-19 11/23/2022   Elevated blood-pressure reading without diagnosis of hypertension 12/26/2021   Screening mammogram, encounter for 12/26/2020   Thrombocytopenia (HCC) 01/20/2019  Tobacco use disorder 10/04/2018   Heavy alcohol consumption 04/23/2018   Depression 04/23/2018   Osteopenia     -We discussed follow-up care including consultation with radiation oncology, and medical oncology regarding estrogen blockade.  Made sure  she understood that we we will be planning on repeat imaging of the right breast in 6 months.  Will be glad to see her back for clinical examination at that time.  I will be happy to see her as needed.   Campbell Lerner M.D., FACS 04/26/2023, 8:57 AM

## 2023-04-26 NOTE — Progress Notes (Signed)
Sandra Franco is going to see Dr. Smith Robert and Dr. Rushie Chestnut on Monday June 3rd.   Appt. Details have been given to her.

## 2023-04-26 NOTE — Patient Instructions (Addendum)
The patient has been asked to return to the office in 6 months with a unilateral left breast diagnostic mammogram and office visit.   We will send you a message about these appointments.   Continue self breast exams. Call office for any new breast issues or concerns.  Oncology should call you to schedule an appointment to see them to discuss the next steps as far as treatment.

## 2023-04-27 ENCOUNTER — Other Ambulatory Visit: Payer: Self-pay

## 2023-04-30 ENCOUNTER — Inpatient Hospital Stay: Payer: Medicare PPO | Attending: Oncology | Admitting: Oncology

## 2023-04-30 ENCOUNTER — Encounter: Payer: Self-pay | Admitting: Oncology

## 2023-04-30 ENCOUNTER — Inpatient Hospital Stay: Payer: Medicare PPO

## 2023-04-30 ENCOUNTER — Ambulatory Visit
Admission: RE | Admit: 2023-04-30 | Discharge: 2023-04-30 | Disposition: A | Discharge status: Home / Self Care | Payer: Medicare PPO | Source: Ambulatory Visit | Attending: Radiation Oncology | Admitting: Radiation Oncology

## 2023-04-30 VITALS — BP 121/85 | HR 89 | Temp 97.5°F | Resp 18 | Ht 65.0 in | Wt 113.4 lb

## 2023-04-30 DIAGNOSIS — D0512 Intraductal carcinoma in situ of left breast: Secondary | ICD-10-CM | POA: Insufficient documentation

## 2023-04-30 DIAGNOSIS — Z79899 Other long term (current) drug therapy: Secondary | ICD-10-CM | POA: Diagnosis not present

## 2023-04-30 DIAGNOSIS — Z170 Estrogen receptor positive status [ER+]: Secondary | ICD-10-CM | POA: Diagnosis not present

## 2023-04-30 DIAGNOSIS — F17210 Nicotine dependence, cigarettes, uncomplicated: Secondary | ICD-10-CM | POA: Diagnosis not present

## 2023-04-30 DIAGNOSIS — Z8616 Personal history of COVID-19: Secondary | ICD-10-CM | POA: Insufficient documentation

## 2023-04-30 DIAGNOSIS — M8580 Other specified disorders of bone density and structure, unspecified site: Secondary | ICD-10-CM | POA: Diagnosis not present

## 2023-04-30 DIAGNOSIS — J449 Chronic obstructive pulmonary disease, unspecified: Secondary | ICD-10-CM | POA: Insufficient documentation

## 2023-04-30 DIAGNOSIS — D0511 Intraductal carcinoma in situ of right breast: Secondary | ICD-10-CM | POA: Diagnosis not present

## 2023-04-30 NOTE — Progress Notes (Signed)
Hematology/Oncology Consult note [facility] [phone] [phone]  Patient Care Team: Alfredia Ferguson, PA-C as PCP - General (Physician Assistant) Ponciano Ort, [facility], Armando Reichert, [place] as Oncology Nurse Navigator   Name of the patient: Sandra Franco  [id number]  17-Jul-1951    Reason for referral-new diagnosis of DCIS of the right breast   Referring physician-Dr. Kieth Brightly   Date of visit: 04/30/23   History of presenting illness-patient is a 72 year old female who underwent a screening mammogram on 02/14/2023 which showed abnormal calcifications in her right breast.  This was followed by diagnostic mammogram as well as MRI which showed calcifications measuring up to 4 mm in extent.  None mass enhancement extending up to 11 mm.  No evidence of contralateral disease in the chest.  Biopsy of the suspicious calcifications as well as the anterior and posterior end was done.  This was consistent with DCIS but the anterior and posterior ends were negative for malignancy.  Patient underwent right lumpectomy by Dr. Claudine Mouton on 04/18/2023.  Final pathology showed 15 mm grade 2 DCIS with negative margins 3 mm.  pTis.  ER more than 90% positive. Last bone density was from 2018 which showed osteopenia with a T-score of -2.2 at the left femur neck.  ECOG PS- 1  Pain scale- 0   Review of systems- Review of Systems  Constitutional:  Negative for chills, fever, malaise/fatigue and weight loss.  HENT:  Negative for congestion, ear discharge and nosebleeds.   Eyes:  Negative for blurred vision.  Respiratory:  Negative for cough, hemoptysis, sputum production, shortness of breath and wheezing.   Cardiovascular:  Negative for chest pain, palpitations, orthopnea and claudication.  Gastrointestinal:  Negative for abdominal pain, blood in stool, constipation, diarrhea, heartburn, melena, nausea and vomiting.  Genitourinary:  Negative for  dysuria, flank pain, frequency, hematuria and urgency.  Musculoskeletal:  Negative for back pain, joint pain and myalgias.  Skin:  Negative for rash.  Neurological:  Negative for dizziness, tingling, focal weakness, seizures, weakness and headaches.  Endo/Heme/Allergies:  Does not bruise/bleed easily.  Psychiatric/Behavioral:  Negative for depression and suicidal ideas. The patient does not have insomnia.     Allergies  Allergen Reactions   Amoxicillin Rash   Cefdinir Rash   Codeine Nausea Only    Patient Active Problem List   Diagnosis Date Noted   Abnormal mammogram of right breast 03/15/2023   Ductal carcinoma in situ (DCIS) of right breast 03/15/2023   COVID-19 11/23/2022   Elevated blood-pressure reading without diagnosis of hypertension 12/26/2021   Screening mammogram, encounter for 12/26/2020   Thrombocytopenia (HCC) 01/20/2019   Tobacco use disorder 10/04/2018   Heavy alcohol consumption 04/23/2018   Depression 04/23/2018   Osteopenia      Past Medical History:  Diagnosis Date   COPD (chronic obstructive pulmonary disease) (HCC)    COVID-19    Depression    Heavy alcohol consumption    Mood disorder (HCC)    Osteopenia    Thrombocytopenia (HCC)    Tobacco use disorder    Tubular adenoma of colon 2019   needs another colonoscopy in 3 years     Past Surgical History:  Procedure Laterality Date   BREAST BIOPSY Right 2014   NEG   BREAST BIOPSY Right 02/27/2023   Right Breast Bx, Ribbon clip - path pending   BREAST BIOPSY Right 02/27/2023   MM RT BREAST BX W LOC DEV 1ST LESION IMAGE BX SPEC STEREO GUIDE  02/27/2023 [facility]   BREAST BIOPSY Right 03/21/2023   Stereo Bx, Coil Clip - path pending   BREAST BIOPSY Right 03/21/2023   Stereo Bx, Ribbon Clip - path pending   BREAST BIOPSY Right 03/21/2023   MM RT BREAST BX W LOC DEV 1ST LESION IMAGE BX SPEC STEREO GUIDE 03/21/2023 [facility]   BREAST BIOPSY Right 03/21/2023   MM RT BREAST BX W LOC DEV  EA AD LESION IMG BX SPEC STEREO GUIDE 03/21/2023 [facility]   BREAST BIOPSY Right 04/05/2023   MM RT RADIO FREQUENCY TAG LOC MAMMO GUIDE 04/05/2023 [facility]   BREAST LUMPECTOMY WITH RADIOFREQUENCY TAG IDENTIFICATION Right 04/18/2023   Procedure: BREAST LUMPECTOMY WITH RADIOFREQUENCY TAG IDENTIFICATION;  Surgeon: Campbell Lerner, MD;  Location: [facility];  Service: General;  Laterality: Right;   COLONOSCOPY  09/23/2012   negative   COLONOSCOPY WITH PROPOFOL N/A 08/09/2018   Procedure: COLONOSCOPY WITH PROPOFOL;  Surgeon: Scot Jun, MD;  Location: [facility] END[facility]COPY;  Service: Endoscopy;  Laterality: N/A;   FINGER FRACTURE SURGERY Right 1984   INDUCED ABORTION     TONSILLECTOMY      Social History   Socioeconomic History   Marital status: Married    Spouse name: Ruffin   Number of children: 1   Years of education: LPN   Highest education level: Some college, no degree  Occupational History   Occupation: retired Geologist, engineering  Tobacco Use   Smoking status: Every Day    Packs/day: 0.50    Years: 51.00    Additional pack years: 0.00    Total pack years: 25.50    Types: Cigarettes    Passive exposure: Past   Smokeless tobacco: Never  Vaping Use   Vaping Use: Never used  Substance and Sexual Activity   Alcohol use: Yes    Alcohol/week: 14.0 - 21.0 standard drinks of alcohol    Types: 14 - 21 Cans of beer per week    Comment: 2-3   Drug use: No   Sexual activity: Not Currently    Birth control/protection: Post-menopausal  Other Topics Concern   Not on file  Social History Narrative   Not on file   Social Determinants of Health   Financial Resource Strain: Low Risk  (01/26/2021)   Overall Financial Resource Strain (CARDIA)    Difficulty of Paying Living Expenses: Not hard at all  Food Insecurity: No Food Insecurity (04/30/2023)   Hunger Vital Sign    Worried About Running Out of Food in the Last Year: Never true    Ran Out of Food in the Last Year: Never  true  Transportation Needs: No Transportation Needs (04/30/2023)   PRAPARE - Administrator, Civil Service (Medical): No    Lack of Transportation (Non-Medical): No  Physical Activity: Inactive (01/26/2021)   Exercise Vital Sign    Days of Exercise per Week: 0 days    Minutes of Exercise per Session: 0 min  Stress: Stress Concern Present (01/26/2021)   [employer] of Occupational Health - Occupational Stress Questionnaire    Feeling of Stress : To some extent  Social Connections: Moderately Isolated (01/26/2021)   Social Connection and Isolation Panel [NHANES]    Frequency of Communication with Friends and Family: Twice a week    Frequency of Social Gatherings with Friends and Family: More than three times a week    Attends Religious Services: Never    Database administrator or Organizations: No    Attends Banker Meetings: Never  Marital Status: Married  Catering manager Violence: Not At Risk (04/30/2023)   Humiliation, Afraid, Rape, and Kick questionnaire    Fear of Current or Ex-Partner: No    Emotionally Abused: No    Physically Abused: No    Sexually Abused: No     Family History  Problem Relation Age of Onset   Rheum arthritis Mother    Hypertension Mother    Lung cancer Father 7   Pancreatic cancer Maternal Aunt 67   Breast cancer Neg Hx      Current Outpatient Medications:    buPROPion (WELLBUTRIN XL) 300 MG 24 hr tablet, Take 1 tablet (300 mg total) by mouth daily., Disp: 90 tablet, Rfl: 0   Calcium Citrate-Vitamin D (CALCIUM + D PO), Take 1 capsule by mouth daily., Disp: , Rfl:    ibuprofen (ADVIL) 200 MG tablet, Take 200 mg by mouth every 6 (six) hours as needed., Disp: , Rfl:    Multiple Vitamin (MULTIVITAMIN) capsule, Take 1 capsule by mouth daily., Disp: , Rfl:    rosuvastatin (CRESTOR) 5 MG tablet, Take 1 tablet (5 mg total) by mouth daily., Disp: 90 tablet, Rfl: 3   Physical exam:  Vitals:   04/30/23 1043  BP: 121/85  Pulse: 89   Resp: 18  Temp: (!) 97.5 F (36.4 C)  TempSrc: Tympanic  SpO2: 100%  Weight: 113 lb 6.4 oz (51.4 kg)  Height: 5\' 5"  (1.651 m)   Physical Exam Cardiovascular:     Rate and Rhythm: Normal rate and regular rhythm.     Heart sounds: Normal heart sounds.  Pulmonary:     Effort: Pulmonary effort is normal.     Breath sounds: Normal breath sounds.  Abdominal:     General: Bowel sounds are normal.     Palpations: Abdomen is soft.  Skin:    General: Skin is warm and dry.  Neurological:     Mental Status: She is alert and oriented to person, place, and time.    Breast exam was performed in seated and lying down position. Patient is status post right lumpectomy with a well-healed surgical scar. No evidence of any palpable masses. No evidence of axillary adenopathy. No evidence of any palpable masses or lumps in the left breast. No evidence of leftt axillary adenopathy        Latest Ref Rng & Units 04/12/2023   10:05 AM  CMP  Glucose 70 - 99 mg/dL 93   BUN 8 - 23 mg/dL 14   Creatinine 1.61 - 1.00 mg/dL 0.96   Sodium 045 - 409 mmol/L 138   Potassium 3.5 - 5.1 mmol/L 3.8   Chloride 98 - 111 mmol/L 100   CO2 22 - 32 mmol/L 27   Calcium 8.9 - 10.3 mg/dL 9.4   Total Protein 6.5 - 8.1 g/dL 7.4   Total Bilirubin 0.3 - 1.2 mg/dL 1.1   Alkaline Phos 38 - 126 U/L 76   AST 15 - 41 U/L 27   ALT 0 - 44 U/L 18       Latest Ref Rng & Units 04/12/2023   10:05 AM  CBC  WBC 4.0 - 10.5 K/uL 6.4   Hemoglobin 12.0 - 15.0 g/dL 81.1   Hematocrit 91.4 - 46.0 % 43.2   Platelets 150 - 400 K/uL 152     No images are attached to the encounter.  MM Breast Surgical Specimen  Result Date: 04/18/2023 CLINICAL DATA:  Status post radiofrequency ID tag localized right breast lumpectomy. EXAM:  SPECIMEN RADIOGRAPH OF THE RIGHT BREAST COMPARISON:  Previous exam(s). FINDINGS: Status post excision of the right breast. The RF ID tag and ribbon shaped clip are present within the specimen. IMPRESSION: Specimen  radiograph of the right breast. Electronically Signed   By: Jacob Moores M.D.   On: 04/18/2023 16:18  MM RT RADIO FREQUENCY TAG LOC MAMMO GUIDE  Result Date: 04/05/2023 CLINICAL DATA:  Patient is status post stereotactic guided biopsy of the RIGHT breast which demonstrated ductal carcinoma in-situ at the RIBBON clip. Additional biopsies demonstrated benign results. EXAM: NEEDLE LOCALIZATION OF THE RIGHT BREAST WITH MAMMO GUIDANCE COMPARISON:  Previous exam(s). FINDINGS: Patient presents for needle localization prior to lumpectomy. I met with the patient and we discussed the procedure of needle localization including benefits and alternatives. We discussed the high likelihood of a successful procedure. We discussed the risks of the procedure, including infection, bleeding, tissue injury, and further surgery. Informed, written consent was given. The usual time-out protocol was performed immediately prior to the procedure. Using mammographic guidance, sterile technique, 1% lidocaine and a 5 cm radiofrequency ID tag needle ([id number]), the RIBBON clip was localized using a lateral approach. Function was confirmed with an auditory signal from the guide. The images were marked for Dr. Claudine Mouton. IMPRESSION: Radiofrequency ID tag localization of the RIGHT breast. No apparent complications. Electronically Signed   By: Meda Klinefelter M.D.   On: 04/05/2023 16:12   Assessment and plan- Patient is a 72 y.o. female with newly diagnosed right breast DCIS ER positive here to discuss further management  Patient is s/p right lumpectomy with negative margins for her DCIS.  She will be discussing with radiation oncology if she wants to go through adjuvant radiation or if this could be potentially omitted.  Given that she had a ER positive DCIS I would recommend 5 years of endocrine therapy for her.  Both tamoxifen or aromatase inhibitors are acceptable options for her.  Discussed risks and benefits of aromatase inhibitors  including all but not limited to hot flashes, mood swings, arthralgias, hyperlipidemia and worsening bone health.  Discussed risks and benefits of tamoxifen including all but not limited to hot flashes, mood swings, risk of cataracts uterine cancer and DVT.  Patient would like to think about this and get back to [place].  Written information about both drugs given to the patient.  She will call us and let us know which way she wants to proceed.  I will see her back in 3 months with CMP   Cancer Staging  Ductal carcinoma in situ (DCIS) of right breast Staging form: Breast, AJCC 8th Edition - Clinical stage from 04/30/2023: Stage 0 (cTis (DCIS), cN0, cM0, ER+, PR: Not Assessed, HER2: Not Assessed) - Signed by Creig Hines, MD on 04/30/2023 Stage prefix: Initial diagnosis Nuclear grade: G2     Thank you for this kind referral and the opportunity to participate in the care of this patient   Visit Diagnosis 1. Ductal carcinoma in situ (DCIS) of right breast     Dr. Owens Shark, MD, MPH [facility] [phone] 04/30/2023

## 2023-04-30 NOTE — Consult Note (Signed)
NEW PATIENT EVALUATION  Name: Sandra Franco  MRN: [id number]  Date:   04/30/2023     DOB: 07-01-1951   This 72 y.o. 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  REFERRING PHYSICIAN: Alfredia Ferguson, PA-C  CHIEF COMPLAINT: No chief complaint on file.   DIAGNOSIS: The encounter diagnosis was Ductal carcinoma in situ (DCIS) of right breast.   PREVIOUS INVESTIGATIONS:  Mammogram and ultrasound and MRI scans reviewed Pathology report reviewed Clinical notes reviewed  HPI: Patient is a 72 year old female who presented with an abnormal mammogram showing abnormal calcifications in the right breast.  These were linear branching calcifications and underwent biopsy which was positive for ductal carcinoma in situ.  This confirmed DCIS in the upper outer quadrant of the right breast.  No evidence of contralateral disease in the left breast.  She underwent a wide local excision showing ductal carcinoma in situ ER positive with margins clear at 3 mm.  No regional lymph nodes were examined.  Tumor was ER positive.  She is tolerated her surgery well and is healing well.  She is now referred to radiation oncology for consideration of treatment.  She specifically denies breast tenderness cough or bone pain.  PLANNED TREATMENT REGIMEN: Hypofractionated whole breast radiation  PAST MEDICAL HISTORY:  has a past medical history of COPD (chronic obstructive pulmonary disease) (HCC), COVID-19, Depression, Heavy alcohol consumption, Mood disorder (HCC), Osteopenia, Thrombocytopenia (HCC), Tobacco use disorder, and Tubular adenoma of colon (2019).    PAST SURGICAL HISTORY:  Past Surgical History:  Procedure Laterality Date   BREAST BIOPSY Right 2014   NEG   BREAST BIOPSY Right 02/27/2023   Right Breast Bx, Ribbon clip - path pending   BREAST BIOPSY Right 02/27/2023   MM RT  BREAST BX W LOC DEV 1ST LESION IMAGE BX SPEC STEREO GUIDE 02/27/2023 [facility]   BREAST BIOPSY Right 03/21/2023   Stereo Bx, Coil Clip - path pending   BREAST BIOPSY Right 03/21/2023   Stereo Bx, Ribbon Clip - path pending   BREAST BIOPSY Right 03/21/2023   MM RT BREAST BX W LOC DEV 1ST LESION IMAGE BX SPEC STEREO GUIDE 03/21/2023 [facility]   BREAST BIOPSY Right 03/21/2023   MM RT BREAST BX W LOC DEV EA AD LESION IMG BX SPEC STEREO GUIDE 03/21/2023 [facility]   BREAST BIOPSY Right 04/05/2023   MM RT RADIO FREQUENCY TAG LOC MAMMO GUIDE 04/05/2023 [facility]   BREAST LUMPECTOMY WITH RADIOFREQUENCY TAG IDENTIFICATION Right 04/18/2023   Procedure: BREAST LUMPECTOMY WITH RADIOFREQUENCY TAG IDENTIFICATION;  Surgeon: Campbell Lerner, MD;  Location: [facility];  Service: General;  Laterality: Right;   COLONOSCOPY  09/23/2012   negative   COLONOSCOPY WITH PROPOFOL N/A 08/09/2018   Procedure: COLONOSCOPY WITH PROPOFOL;  Surgeon: Scot Jun, MD;  Location: [facility] ENDOSCOPY;  Service: Endoscopy;  Laterality: N/A;   FINGER FRACTURE SURGERY Right 1984   INDUCED ABORTION     TONSILLECTOMY      FAMILY HISTORY: family history includes Hypertension in her mother; Lung cancer (age of onset: 55) in her father; Pancreatic cancer (age of onset: 48) in her maternal aunt; Rheum arthritis in her mother.  SOCIAL HISTORY:  reports that she has been smoking cigarettes. She has a 25.50 pack-year smoking history. She has been exposed to tobacco smoke. She has never used smokeless tobacco. She reports current alcohol use of about 14.0 -  21.0 standard drinks of alcohol per week. She reports that she does not use drugs.  ALLERGIES: Amoxicillin, Cefdinir, and Codeine  MEDICATIONS:  Current Outpatient Medications  Medication Sig Dispense Refill   buPROPion (WELLBUTRIN XL) 300 MG 24 hr tablet Take 1 tablet (300 mg total) by mouth daily. 90 tablet 0   Calcium Citrate-Vitamin D (CALCIUM + D PO) Take 1  capsule by mouth daily.     ibuprofen (ADVIL) 200 MG tablet Take 200 mg by mouth every 6 (six) hours as needed.     Multiple Vitamin (MULTIVITAMIN) capsule Take 1 capsule by mouth daily.     rosuvastatin (CRESTOR) 5 MG tablet Take 1 tablet (5 mg total) by mouth daily. 90 tablet 3   No current facility-administered medications for this encounter.    ECOG PERFORMANCE STATUS:  0 - Asymptomatic  REVIEW OF SYSTEMS: Patient denies any weight loss, fatigue, weakness, fever, chills or night sweats. Patient denies any loss of vision, blurred vision. Patient denies any ringing  of the ears or hearing loss. No irregular heartbeat. Patient denies heart murmur or history of fainting. Patient denies any chest pain or pain radiating to her upper extremities. Patient denies any shortness of breath, difficulty breathing at night, cough or hemoptysis. Patient denies any swelling in the lower legs. Patient denies any nausea vomiting, vomiting of blood, or coffee ground material in the vomitus. Patient denies any stomach pain. Patient states has had normal bowel movements no significant constipation or diarrhea. Patient denies any dysuria, hematuria or significant nocturia. Patient denies any problems walking, swelling in the joints or loss of balance. Patient denies any skin changes, loss of hair or loss of weight. Patient denies any excessive worrying or anxiety or significant depression. Patient denies any problems with insomnia. Patient denies excessive thirst, polyuria, polydipsia. Patient denies any swollen glands, patient denies easy bruising or easy bleeding. Patient denies any recent infections, allergies or URI. Patient "s visual fields have not changed significantly in recent time.   PHYSICAL EXAM: There were no vitals taken for this visit. Patient status post right wide local excision.  Incisions well-healed no dominant masses noted in either breast no axillary or supraclavicular adenopathy is appreciated.   Well-developed well-nourished patient in NAD. HEENT reveals PERLA, EOMI, discs not visualized.  Oral cavity is clear. No oral mucosal lesions are identified. Neck is clear without evidence of cervical or supraclavicular adenopathy. Lungs are clear to A&P. Cardiac examination is essentially unremarkable with regular rate and rhythm without murmur rub or thrill. Abdomen is benign with no organomegaly or masses noted. Motor sensory and DTR levels are equal and symmetric in the upper and lower extremities. Cranial nerves II through XII are grossly intact. Proprioception is intact. No peripheral adenopathy or edema is identified. No motor or sensory levels are noted. Crude visual fields are within normal range.  LABORATORY DATA: Pathology reports reviewed    RADIOLOGY RESULTS: Mammograms ultrasound and MRI scans reviewed compatible with above-stated findings   IMPRESSION: ER positive ductal carcinoma site to the right breast status post wide local excision in 72 year old female  PLAN: At this time I recommended a hypofractionated course of whole breast radiation to her right breast would also boost her scar another 1000 centigrade based on the 3 mm margin.  Risks and benefits of treatment including skin reaction fatigue possible inclusion of superficial lung all were discussed in detail with the patient.  I personally set up and ordered CT simulation for later this week.  She will  also qualify for endocrine therapy after completion of radiation.  I would like to take this opportunity to thank you for allowing me to participate in the care of your patient.Carmina Miller, MD

## 2023-05-02 ENCOUNTER — Encounter: Payer: Self-pay | Admitting: *Deleted

## 2023-05-02 ENCOUNTER — Ambulatory Visit
Admission: RE | Admit: 2023-05-02 | Discharge: 2023-05-02 | Disposition: A | Discharge status: Home / Self Care | Payer: Medicare PPO | Source: Ambulatory Visit | Attending: Radiation Oncology | Admitting: Radiation Oncology

## 2023-05-02 DIAGNOSIS — Z510 Encounter for antineoplastic radiation therapy: Secondary | ICD-10-CM | POA: Diagnosis not present

## 2023-05-02 DIAGNOSIS — Z170 Estrogen receptor positive status [ER+]: Secondary | ICD-10-CM | POA: Diagnosis not present

## 2023-05-02 DIAGNOSIS — D0512 Intraductal carcinoma in situ of left breast: Secondary | ICD-10-CM | POA: Diagnosis not present

## 2023-05-02 DIAGNOSIS — D0511 Intraductal carcinoma in situ of right breast: Secondary | ICD-10-CM | POA: Diagnosis not present

## 2023-05-04 DIAGNOSIS — D0512 Intraductal carcinoma in situ of left breast: Secondary | ICD-10-CM | POA: Diagnosis not present

## 2023-05-04 DIAGNOSIS — D0511 Intraductal carcinoma in situ of right breast: Secondary | ICD-10-CM | POA: Diagnosis not present

## 2023-05-04 DIAGNOSIS — Z170 Estrogen receptor positive status [ER+]: Secondary | ICD-10-CM | POA: Diagnosis not present

## 2023-05-04 DIAGNOSIS — Z510 Encounter for antineoplastic radiation therapy: Secondary | ICD-10-CM | POA: Diagnosis not present

## 2023-05-08 ENCOUNTER — Ambulatory Visit: Admission: RE | Admit: 2023-05-08 | Payer: Medicare PPO | Source: Ambulatory Visit

## 2023-05-08 DIAGNOSIS — L821 Other seborrheic keratosis: Secondary | ICD-10-CM | POA: Diagnosis not present

## 2023-05-08 DIAGNOSIS — D2262 Melanocytic nevi of left upper limb, including shoulder: Secondary | ICD-10-CM | POA: Diagnosis not present

## 2023-05-08 DIAGNOSIS — D2271 Melanocytic nevi of right lower limb, including hip: Secondary | ICD-10-CM | POA: Diagnosis not present

## 2023-05-08 DIAGNOSIS — D0511 Intraductal carcinoma in situ of right breast: Secondary | ICD-10-CM | POA: Diagnosis not present

## 2023-05-08 DIAGNOSIS — D0512 Intraductal carcinoma in situ of left breast: Secondary | ICD-10-CM | POA: Diagnosis not present

## 2023-05-08 DIAGNOSIS — Z08 Encounter for follow-up examination after completed treatment for malignant neoplasm: Secondary | ICD-10-CM | POA: Diagnosis not present

## 2023-05-08 DIAGNOSIS — Z170 Estrogen receptor positive status [ER+]: Secondary | ICD-10-CM | POA: Diagnosis not present

## 2023-05-08 DIAGNOSIS — Z85828 Personal history of other malignant neoplasm of skin: Secondary | ICD-10-CM | POA: Diagnosis not present

## 2023-05-08 DIAGNOSIS — D2272 Melanocytic nevi of left lower limb, including hip: Secondary | ICD-10-CM | POA: Diagnosis not present

## 2023-05-08 DIAGNOSIS — Z510 Encounter for antineoplastic radiation therapy: Secondary | ICD-10-CM | POA: Diagnosis not present

## 2023-05-08 DIAGNOSIS — D2261 Melanocytic nevi of right upper limb, including shoulder: Secondary | ICD-10-CM | POA: Diagnosis not present

## 2023-05-08 DIAGNOSIS — D225 Melanocytic nevi of trunk: Secondary | ICD-10-CM | POA: Diagnosis not present

## 2023-05-08 DIAGNOSIS — L570 Actinic keratosis: Secondary | ICD-10-CM | POA: Diagnosis not present

## 2023-05-09 ENCOUNTER — Ambulatory Visit
Admission: RE | Admit: 2023-05-09 | Discharge: 2023-05-09 | Disposition: A | Discharge status: Home / Self Care | Payer: Medicare PPO | Source: Ambulatory Visit | Attending: Radiation Oncology | Admitting: Radiation Oncology

## 2023-05-09 ENCOUNTER — Other Ambulatory Visit: Payer: Self-pay

## 2023-05-09 ENCOUNTER — Other Ambulatory Visit: Payer: Self-pay | Admitting: *Deleted

## 2023-05-09 DIAGNOSIS — D0511 Intraductal carcinoma in situ of right breast: Secondary | ICD-10-CM

## 2023-05-09 DIAGNOSIS — Z510 Encounter for antineoplastic radiation therapy: Secondary | ICD-10-CM | POA: Diagnosis not present

## 2023-05-09 DIAGNOSIS — Z170 Estrogen receptor positive status [ER+]: Secondary | ICD-10-CM | POA: Diagnosis not present

## 2023-05-09 DIAGNOSIS — D0512 Intraductal carcinoma in situ of left breast: Secondary | ICD-10-CM | POA: Diagnosis not present

## 2023-05-09 LAB — RAD ONC ARIA SESSION SUMMARY
Course Elapsed Days: 0
Plan Fractions Treated to Date: 1
Plan Prescribed Dose Per Fraction: 2.66 Gy
Plan Total Fractions Prescribed: 16
Plan Total Prescribed Dose: 42.56 Gy
Reference Point Dosage Given to Date: 2.66 Gy
Reference Point Session Dosage Given: 2.66 Gy
Session Number: 1

## 2023-05-10 ENCOUNTER — Ambulatory Visit
Admission: RE | Admit: 2023-05-10 | Discharge: 2023-05-10 | Disposition: A | Discharge status: Home / Self Care | Payer: Medicare PPO | Source: Ambulatory Visit | Attending: Radiation Oncology | Admitting: Radiation Oncology

## 2023-05-10 ENCOUNTER — Other Ambulatory Visit: Payer: Self-pay

## 2023-05-10 DIAGNOSIS — D0512 Intraductal carcinoma in situ of left breast: Secondary | ICD-10-CM | POA: Diagnosis not present

## 2023-05-10 DIAGNOSIS — Z170 Estrogen receptor positive status [ER+]: Secondary | ICD-10-CM | POA: Diagnosis not present

## 2023-05-10 DIAGNOSIS — Z510 Encounter for antineoplastic radiation therapy: Secondary | ICD-10-CM | POA: Diagnosis not present

## 2023-05-10 DIAGNOSIS — D0511 Intraductal carcinoma in situ of right breast: Secondary | ICD-10-CM | POA: Diagnosis not present

## 2023-05-10 LAB — RAD ONC ARIA SESSION SUMMARY
Course Elapsed Days: 1
Plan Fractions Treated to Date: 2
Plan Prescribed Dose Per Fraction: 2.66 Gy
Plan Total Fractions Prescribed: 16
Plan Total Prescribed Dose: 42.56 Gy
Reference Point Dosage Given to Date: 5.32 Gy
Reference Point Session Dosage Given: 2.66 Gy
Session Number: 2

## 2023-05-11 ENCOUNTER — Ambulatory Visit
Admission: RE | Admit: 2023-05-11 | Discharge: 2023-05-11 | Disposition: A | Discharge status: Home / Self Care | Payer: Medicare PPO | Source: Ambulatory Visit | Attending: Radiation Oncology | Admitting: Radiation Oncology

## 2023-05-11 ENCOUNTER — Other Ambulatory Visit: Payer: Self-pay

## 2023-05-11 DIAGNOSIS — D0512 Intraductal carcinoma in situ of left breast: Secondary | ICD-10-CM | POA: Diagnosis not present

## 2023-05-11 DIAGNOSIS — Z510 Encounter for antineoplastic radiation therapy: Secondary | ICD-10-CM | POA: Diagnosis not present

## 2023-05-11 DIAGNOSIS — D0511 Intraductal carcinoma in situ of right breast: Secondary | ICD-10-CM | POA: Diagnosis not present

## 2023-05-11 DIAGNOSIS — Z170 Estrogen receptor positive status [ER+]: Secondary | ICD-10-CM | POA: Diagnosis not present

## 2023-05-11 LAB — RAD ONC ARIA SESSION SUMMARY
Course Elapsed Days: 2
Plan Fractions Treated to Date: 3
Plan Prescribed Dose Per Fraction: 2.66 Gy
Plan Total Fractions Prescribed: 16
Plan Total Prescribed Dose: 42.56 Gy
Reference Point Dosage Given to Date: 7.98 Gy
Reference Point Session Dosage Given: 2.66 Gy
Session Number: 3

## 2023-05-14 ENCOUNTER — Other Ambulatory Visit: Payer: Self-pay

## 2023-05-14 ENCOUNTER — Ambulatory Visit
Admission: RE | Admit: 2023-05-14 | Discharge: 2023-05-14 | Disposition: A | Discharge status: Home / Self Care | Payer: Medicare PPO | Source: Ambulatory Visit | Attending: Radiation Oncology | Admitting: Radiation Oncology

## 2023-05-14 DIAGNOSIS — Z170 Estrogen receptor positive status [ER+]: Secondary | ICD-10-CM | POA: Diagnosis not present

## 2023-05-14 DIAGNOSIS — D0512 Intraductal carcinoma in situ of left breast: Secondary | ICD-10-CM | POA: Diagnosis not present

## 2023-05-14 DIAGNOSIS — Z510 Encounter for antineoplastic radiation therapy: Secondary | ICD-10-CM | POA: Diagnosis not present

## 2023-05-14 DIAGNOSIS — D0511 Intraductal carcinoma in situ of right breast: Secondary | ICD-10-CM | POA: Diagnosis not present

## 2023-05-14 LAB — RAD ONC ARIA SESSION SUMMARY
Course Elapsed Days: 5
Plan Fractions Treated to Date: 4
Plan Prescribed Dose Per Fraction: 2.66 Gy
Plan Total Fractions Prescribed: 16
Plan Total Prescribed Dose: 42.56 Gy
Reference Point Dosage Given to Date: 10.64 Gy
Reference Point Session Dosage Given: 2.66 Gy
Session Number: 4

## 2023-05-15 ENCOUNTER — Ambulatory Visit
Admission: RE | Admit: 2023-05-15 | Discharge: 2023-05-15 | Disposition: A | Discharge status: Home / Self Care | Payer: Medicare PPO | Source: Ambulatory Visit | Attending: Radiation Oncology | Admitting: Radiation Oncology

## 2023-05-15 ENCOUNTER — Other Ambulatory Visit: Payer: Self-pay

## 2023-05-15 ENCOUNTER — Encounter: Payer: Self-pay | Admitting: Physician Assistant

## 2023-05-15 DIAGNOSIS — Z510 Encounter for antineoplastic radiation therapy: Secondary | ICD-10-CM | POA: Diagnosis not present

## 2023-05-15 DIAGNOSIS — D0512 Intraductal carcinoma in situ of left breast: Secondary | ICD-10-CM | POA: Diagnosis not present

## 2023-05-15 DIAGNOSIS — D0511 Intraductal carcinoma in situ of right breast: Secondary | ICD-10-CM | POA: Diagnosis not present

## 2023-05-15 DIAGNOSIS — Z170 Estrogen receptor positive status [ER+]: Secondary | ICD-10-CM | POA: Diagnosis not present

## 2023-05-15 LAB — RAD ONC ARIA SESSION SUMMARY
Course Elapsed Days: 6
Plan Fractions Treated to Date: 5
Plan Prescribed Dose Per Fraction: 2.66 Gy
Plan Total Fractions Prescribed: 16
Plan Total Prescribed Dose: 42.56 Gy
Reference Point Dosage Given to Date: 13.3 Gy
Reference Point Session Dosage Given: 2.66 Gy
Session Number: 5

## 2023-05-16 ENCOUNTER — Inpatient Hospital Stay: Payer: Medicare PPO

## 2023-05-16 ENCOUNTER — Ambulatory Visit
Admission: RE | Admit: 2023-05-16 | Discharge: 2023-05-16 | Disposition: A | Discharge status: Home / Self Care | Payer: Medicare PPO | Source: Ambulatory Visit | Attending: Radiation Oncology | Admitting: Radiation Oncology

## 2023-05-16 ENCOUNTER — Other Ambulatory Visit: Payer: Self-pay

## 2023-05-16 DIAGNOSIS — Z510 Encounter for antineoplastic radiation therapy: Secondary | ICD-10-CM | POA: Diagnosis not present

## 2023-05-16 DIAGNOSIS — D0511 Intraductal carcinoma in situ of right breast: Secondary | ICD-10-CM

## 2023-05-16 DIAGNOSIS — D0512 Intraductal carcinoma in situ of left breast: Secondary | ICD-10-CM | POA: Diagnosis not present

## 2023-05-16 DIAGNOSIS — Z170 Estrogen receptor positive status [ER+]: Secondary | ICD-10-CM | POA: Diagnosis not present

## 2023-05-16 LAB — RAD ONC ARIA SESSION SUMMARY
Course Elapsed Days: 7
Plan Fractions Treated to Date: 6
Plan Prescribed Dose Per Fraction: 2.66 Gy
Plan Total Fractions Prescribed: 16
Plan Total Prescribed Dose: 42.56 Gy
Reference Point Dosage Given to Date: 15.96 Gy
Reference Point Session Dosage Given: 2.66 Gy
Session Number: 6

## 2023-05-16 LAB — CBC (CANCER CENTER ONLY)
HCT: 41.7 % (ref 36.0–46.0)
Hemoglobin: 13.8 g/dL (ref 12.0–15.0)
MCH: 30.2 pg (ref 26.0–34.0)
MCHC: 33.1 g/dL (ref 30.0–36.0)
MCV: 91.2 fL (ref 80.0–100.0)
Platelet Count: 223 10*3/uL (ref 150–400)
RBC: 4.57 MIL/uL (ref 3.87–5.11)
RDW: 13.2 % (ref 11.5–15.5)
WBC Count: 6.5 10*3/uL (ref 4.0–10.5)
nRBC: 0 % (ref 0.0–0.2)

## 2023-05-17 ENCOUNTER — Ambulatory Visit
Admission: RE | Admit: 2023-05-17 | Discharge: 2023-05-17 | Disposition: A | Discharge status: Home / Self Care | Payer: Medicare PPO | Source: Ambulatory Visit | Attending: Radiation Oncology | Admitting: Radiation Oncology

## 2023-05-17 ENCOUNTER — Other Ambulatory Visit: Payer: Self-pay

## 2023-05-17 DIAGNOSIS — D0512 Intraductal carcinoma in situ of left breast: Secondary | ICD-10-CM | POA: Diagnosis not present

## 2023-05-17 DIAGNOSIS — Z510 Encounter for antineoplastic radiation therapy: Secondary | ICD-10-CM | POA: Diagnosis not present

## 2023-05-17 DIAGNOSIS — D0511 Intraductal carcinoma in situ of right breast: Secondary | ICD-10-CM | POA: Diagnosis not present

## 2023-05-17 DIAGNOSIS — Z170 Estrogen receptor positive status [ER+]: Secondary | ICD-10-CM | POA: Diagnosis not present

## 2023-05-17 LAB — RAD ONC ARIA SESSION SUMMARY
Course Elapsed Days: 8
Plan Fractions Treated to Date: 7
Plan Prescribed Dose Per Fraction: 2.66 Gy
Plan Total Fractions Prescribed: 16
Plan Total Prescribed Dose: 42.56 Gy
Reference Point Dosage Given to Date: 18.62 Gy
Reference Point Session Dosage Given: 2.66 Gy
Session Number: 7

## 2023-05-18 ENCOUNTER — Other Ambulatory Visit: Payer: Self-pay

## 2023-05-18 ENCOUNTER — Ambulatory Visit
Admission: RE | Admit: 2023-05-18 | Discharge: 2023-05-18 | Disposition: A | Discharge status: Home / Self Care | Payer: Medicare PPO | Source: Ambulatory Visit | Attending: Radiation Oncology | Admitting: Radiation Oncology

## 2023-05-18 DIAGNOSIS — D0512 Intraductal carcinoma in situ of left breast: Secondary | ICD-10-CM | POA: Diagnosis not present

## 2023-05-18 DIAGNOSIS — D0511 Intraductal carcinoma in situ of right breast: Secondary | ICD-10-CM | POA: Diagnosis not present

## 2023-05-18 DIAGNOSIS — Z510 Encounter for antineoplastic radiation therapy: Secondary | ICD-10-CM | POA: Diagnosis not present

## 2023-05-18 DIAGNOSIS — Z170 Estrogen receptor positive status [ER+]: Secondary | ICD-10-CM | POA: Diagnosis not present

## 2023-05-18 LAB — RAD ONC ARIA SESSION SUMMARY
Course Elapsed Days: 9
Plan Fractions Treated to Date: 8
Plan Prescribed Dose Per Fraction: 2.66 Gy
Plan Total Fractions Prescribed: 16
Plan Total Prescribed Dose: 42.56 Gy
Reference Point Dosage Given to Date: 21.28 Gy
Reference Point Session Dosage Given: 2.66 Gy
Session Number: 8

## 2023-05-21 ENCOUNTER — Other Ambulatory Visit: Payer: Self-pay

## 2023-05-21 ENCOUNTER — Ambulatory Visit
Admission: RE | Admit: 2023-05-21 | Discharge: 2023-05-21 | Disposition: A | Discharge status: Home / Self Care | Payer: Medicare PPO | Source: Ambulatory Visit | Attending: Radiation Oncology | Admitting: Radiation Oncology

## 2023-05-21 DIAGNOSIS — Z510 Encounter for antineoplastic radiation therapy: Secondary | ICD-10-CM | POA: Diagnosis not present

## 2023-05-21 DIAGNOSIS — D0512 Intraductal carcinoma in situ of left breast: Secondary | ICD-10-CM | POA: Diagnosis not present

## 2023-05-21 DIAGNOSIS — D0511 Intraductal carcinoma in situ of right breast: Secondary | ICD-10-CM | POA: Diagnosis not present

## 2023-05-21 DIAGNOSIS — Z170 Estrogen receptor positive status [ER+]: Secondary | ICD-10-CM | POA: Diagnosis not present

## 2023-05-21 LAB — RAD ONC ARIA SESSION SUMMARY
Course Elapsed Days: 12
Plan Fractions Treated to Date: 9
Plan Prescribed Dose Per Fraction: 2.66 Gy
Plan Total Fractions Prescribed: 16
Plan Total Prescribed Dose: 42.56 Gy
Reference Point Dosage Given to Date: 23.94 Gy
Reference Point Session Dosage Given: 2.66 Gy
Session Number: 9

## 2023-05-22 ENCOUNTER — Ambulatory Visit
Admission: RE | Admit: 2023-05-22 | Discharge: 2023-05-22 | Disposition: A | Discharge status: Home / Self Care | Payer: Medicare PPO | Source: Ambulatory Visit | Attending: Radiation Oncology | Admitting: Radiation Oncology

## 2023-05-22 ENCOUNTER — Other Ambulatory Visit: Payer: Self-pay

## 2023-05-22 DIAGNOSIS — D0511 Intraductal carcinoma in situ of right breast: Secondary | ICD-10-CM | POA: Diagnosis not present

## 2023-05-22 DIAGNOSIS — Z510 Encounter for antineoplastic radiation therapy: Secondary | ICD-10-CM | POA: Diagnosis not present

## 2023-05-22 DIAGNOSIS — Z170 Estrogen receptor positive status [ER+]: Secondary | ICD-10-CM | POA: Diagnosis not present

## 2023-05-22 DIAGNOSIS — D0512 Intraductal carcinoma in situ of left breast: Secondary | ICD-10-CM | POA: Diagnosis not present

## 2023-05-22 LAB — RAD ONC ARIA SESSION SUMMARY
Course Elapsed Days: 13
Plan Fractions Treated to Date: 10
Plan Prescribed Dose Per Fraction: 2.66 Gy
Plan Total Fractions Prescribed: 16
Plan Total Prescribed Dose: 42.56 Gy
Reference Point Dosage Given to Date: 26.6 Gy
Reference Point Session Dosage Given: 2.66 Gy
Session Number: 10

## 2023-05-23 ENCOUNTER — Ambulatory Visit
Admission: RE | Admit: 2023-05-23 | Discharge: 2023-05-23 | Disposition: A | Discharge status: Home / Self Care | Payer: Medicare PPO | Source: Ambulatory Visit | Attending: Radiation Oncology | Admitting: Radiation Oncology

## 2023-05-23 ENCOUNTER — Other Ambulatory Visit: Payer: Self-pay

## 2023-05-23 DIAGNOSIS — D0511 Intraductal carcinoma in situ of right breast: Secondary | ICD-10-CM | POA: Diagnosis not present

## 2023-05-23 DIAGNOSIS — Z510 Encounter for antineoplastic radiation therapy: Secondary | ICD-10-CM | POA: Diagnosis not present

## 2023-05-23 DIAGNOSIS — D0512 Intraductal carcinoma in situ of left breast: Secondary | ICD-10-CM | POA: Diagnosis not present

## 2023-05-23 DIAGNOSIS — Z170 Estrogen receptor positive status [ER+]: Secondary | ICD-10-CM | POA: Diagnosis not present

## 2023-05-23 LAB — RAD ONC ARIA SESSION SUMMARY
Course Elapsed Days: 14
Plan Fractions Treated to Date: 11
Plan Prescribed Dose Per Fraction: 2.66 Gy
Plan Total Fractions Prescribed: 16
Plan Total Prescribed Dose: 42.56 Gy
Reference Point Dosage Given to Date: 29.26 Gy
Reference Point Session Dosage Given: 2.66 Gy
Session Number: 11

## 2023-05-24 ENCOUNTER — Other Ambulatory Visit: Payer: Self-pay

## 2023-05-24 ENCOUNTER — Ambulatory Visit
Admission: RE | Admit: 2023-05-24 | Discharge: 2023-05-24 | Disposition: A | Discharge status: Home / Self Care | Payer: Medicare PPO | Source: Ambulatory Visit | Attending: Radiation Oncology | Admitting: Radiation Oncology

## 2023-05-24 DIAGNOSIS — D0511 Intraductal carcinoma in situ of right breast: Secondary | ICD-10-CM | POA: Diagnosis not present

## 2023-05-24 DIAGNOSIS — Z510 Encounter for antineoplastic radiation therapy: Secondary | ICD-10-CM | POA: Diagnosis not present

## 2023-05-24 DIAGNOSIS — D0512 Intraductal carcinoma in situ of left breast: Secondary | ICD-10-CM | POA: Diagnosis not present

## 2023-05-24 DIAGNOSIS — Z170 Estrogen receptor positive status [ER+]: Secondary | ICD-10-CM | POA: Diagnosis not present

## 2023-05-24 LAB — RAD ONC ARIA SESSION SUMMARY
Course Elapsed Days: 15
Plan Fractions Treated to Date: 12
Plan Prescribed Dose Per Fraction: 2.66 Gy
Plan Total Fractions Prescribed: 16
Plan Total Prescribed Dose: 42.56 Gy
Reference Point Dosage Given to Date: 31.92 Gy
Reference Point Session Dosage Given: 2.66 Gy
Session Number: 12

## 2023-05-25 ENCOUNTER — Other Ambulatory Visit: Payer: Self-pay

## 2023-05-25 ENCOUNTER — Other Ambulatory Visit: Payer: Self-pay | Admitting: *Deleted

## 2023-05-25 ENCOUNTER — Ambulatory Visit
Admission: RE | Admit: 2023-05-25 | Discharge: 2023-05-25 | Disposition: A | Discharge status: Home / Self Care | Payer: Medicare PPO | Source: Ambulatory Visit | Attending: Radiation Oncology | Admitting: Radiation Oncology

## 2023-05-25 DIAGNOSIS — Z510 Encounter for antineoplastic radiation therapy: Secondary | ICD-10-CM | POA: Diagnosis not present

## 2023-05-25 DIAGNOSIS — Z170 Estrogen receptor positive status [ER+]: Secondary | ICD-10-CM | POA: Diagnosis not present

## 2023-05-25 DIAGNOSIS — D0511 Intraductal carcinoma in situ of right breast: Secondary | ICD-10-CM | POA: Diagnosis not present

## 2023-05-25 DIAGNOSIS — D0512 Intraductal carcinoma in situ of left breast: Secondary | ICD-10-CM | POA: Diagnosis not present

## 2023-05-25 LAB — RAD ONC ARIA SESSION SUMMARY
Course Elapsed Days: 16
Plan Fractions Treated to Date: 13
Plan Prescribed Dose Per Fraction: 2.66 Gy
Plan Total Fractions Prescribed: 16
Plan Total Prescribed Dose: 42.56 Gy
Reference Point Dosage Given to Date: 34.58 Gy
Reference Point Session Dosage Given: 2.66 Gy
Session Number: 13

## 2023-05-28 ENCOUNTER — Ambulatory Visit
Admission: RE | Admit: 2023-05-28 | Discharge: 2023-05-28 | Disposition: A | Discharge status: Home / Self Care | Payer: Medicare PPO | Source: Ambulatory Visit | Attending: Radiation Oncology | Admitting: Radiation Oncology

## 2023-05-28 ENCOUNTER — Other Ambulatory Visit: Payer: Self-pay

## 2023-05-28 DIAGNOSIS — Z510 Encounter for antineoplastic radiation therapy: Secondary | ICD-10-CM | POA: Diagnosis not present

## 2023-05-28 DIAGNOSIS — Z170 Estrogen receptor positive status [ER+]: Secondary | ICD-10-CM | POA: Diagnosis not present

## 2023-05-28 DIAGNOSIS — D0511 Intraductal carcinoma in situ of right breast: Secondary | ICD-10-CM | POA: Insufficient documentation

## 2023-05-28 LAB — RAD ONC ARIA SESSION SUMMARY
Course Elapsed Days: 19
Plan Fractions Treated to Date: 14
Plan Prescribed Dose Per Fraction: 2.66 Gy
Plan Total Fractions Prescribed: 16
Plan Total Prescribed Dose: 42.56 Gy
Reference Point Dosage Given to Date: 37.24 Gy
Reference Point Session Dosage Given: 2.66 Gy
Session Number: 14

## 2023-05-29 ENCOUNTER — Other Ambulatory Visit: Payer: Self-pay

## 2023-05-29 ENCOUNTER — Ambulatory Visit
Admission: RE | Admit: 2023-05-29 | Discharge: 2023-05-29 | Disposition: A | Discharge status: Home / Self Care | Payer: Medicare PPO | Source: Ambulatory Visit | Attending: Radiation Oncology | Admitting: Radiation Oncology

## 2023-05-29 DIAGNOSIS — Z170 Estrogen receptor positive status [ER+]: Secondary | ICD-10-CM | POA: Diagnosis not present

## 2023-05-29 DIAGNOSIS — Z510 Encounter for antineoplastic radiation therapy: Secondary | ICD-10-CM | POA: Diagnosis not present

## 2023-05-29 DIAGNOSIS — D0511 Intraductal carcinoma in situ of right breast: Secondary | ICD-10-CM | POA: Diagnosis not present

## 2023-05-29 LAB — RAD ONC ARIA SESSION SUMMARY
Course Elapsed Days: 20
Plan Fractions Treated to Date: 15
Plan Prescribed Dose Per Fraction: 2.66 Gy
Plan Total Fractions Prescribed: 16
Plan Total Prescribed Dose: 42.56 Gy
Reference Point Dosage Given to Date: 39.9 Gy
Reference Point Session Dosage Given: 2.66 Gy
Session Number: 15

## 2023-05-30 ENCOUNTER — Inpatient Hospital Stay: Payer: Medicare PPO

## 2023-05-30 ENCOUNTER — Ambulatory Visit
Admission: RE | Admit: 2023-05-30 | Discharge: 2023-05-30 | Disposition: A | Discharge status: Home / Self Care | Payer: Medicare PPO | Source: Ambulatory Visit | Attending: Radiation Oncology | Admitting: Radiation Oncology

## 2023-05-30 ENCOUNTER — Other Ambulatory Visit: Payer: Self-pay

## 2023-05-30 ENCOUNTER — Ambulatory Visit: Admission: RE | Admit: 2023-05-30 | Payer: Medicare PPO | Source: Ambulatory Visit

## 2023-05-30 DIAGNOSIS — Z510 Encounter for antineoplastic radiation therapy: Secondary | ICD-10-CM | POA: Diagnosis not present

## 2023-05-30 DIAGNOSIS — D0511 Intraductal carcinoma in situ of right breast: Secondary | ICD-10-CM | POA: Diagnosis not present

## 2023-05-30 DIAGNOSIS — Z170 Estrogen receptor positive status [ER+]: Secondary | ICD-10-CM | POA: Diagnosis not present

## 2023-05-30 LAB — RAD ONC ARIA SESSION SUMMARY
Course Elapsed Days: 21
Plan Fractions Treated to Date: 16
Plan Prescribed Dose Per Fraction: 2.66 Gy
Plan Total Fractions Prescribed: 16
Plan Total Prescribed Dose: 42.56 Gy
Reference Point Dosage Given to Date: 42.56 Gy
Reference Point Session Dosage Given: 2.66 Gy
Session Number: 16

## 2023-05-30 LAB — CBC (CANCER CENTER ONLY)
HCT: 41.2 % (ref 36.0–46.0)
Hemoglobin: 13.9 g/dL (ref 12.0–15.0)
MCH: 30.6 pg (ref 26.0–34.0)
MCHC: 33.7 g/dL (ref 30.0–36.0)
MCV: 90.7 fL (ref 80.0–100.0)
Platelet Count: 216 10*3/uL (ref 150–400)
RBC: 4.54 MIL/uL (ref 3.87–5.11)
RDW: 13.4 % (ref 11.5–15.5)
WBC Count: 5.3 10*3/uL (ref 4.0–10.5)
nRBC: 0 % (ref 0.0–0.2)

## 2023-06-01 ENCOUNTER — Other Ambulatory Visit: Payer: Self-pay

## 2023-06-01 ENCOUNTER — Ambulatory Visit
Admission: RE | Admit: 2023-06-01 | Discharge: 2023-06-01 | Disposition: A | Discharge status: Home / Self Care | Payer: Medicare PPO | Source: Ambulatory Visit | Attending: Radiation Oncology | Admitting: Radiation Oncology

## 2023-06-01 DIAGNOSIS — Z170 Estrogen receptor positive status [ER+]: Secondary | ICD-10-CM | POA: Diagnosis not present

## 2023-06-01 DIAGNOSIS — D0511 Intraductal carcinoma in situ of right breast: Secondary | ICD-10-CM | POA: Diagnosis not present

## 2023-06-01 DIAGNOSIS — Z510 Encounter for antineoplastic radiation therapy: Secondary | ICD-10-CM | POA: Diagnosis not present

## 2023-06-01 LAB — RAD ONC ARIA SESSION SUMMARY
Course Elapsed Days: 23
Plan Fractions Treated to Date: 1
Plan Prescribed Dose Per Fraction: 2 Gy
Plan Total Fractions Prescribed: 5
Plan Total Prescribed Dose: 10 Gy
Reference Point Dosage Given to Date: 2 Gy
Reference Point Session Dosage Given: 2 Gy
Session Number: 17

## 2023-06-04 ENCOUNTER — Ambulatory Visit
Admission: RE | Admit: 2023-06-04 | Discharge: 2023-06-04 | Disposition: A | Discharge status: Home / Self Care | Payer: Medicare PPO | Source: Ambulatory Visit | Attending: Radiation Oncology | Admitting: Radiation Oncology

## 2023-06-04 ENCOUNTER — Other Ambulatory Visit: Payer: Self-pay

## 2023-06-04 ENCOUNTER — Other Ambulatory Visit: Payer: Self-pay | Admitting: *Deleted

## 2023-06-04 ENCOUNTER — Telehealth: Payer: Self-pay | Admitting: *Deleted

## 2023-06-04 DIAGNOSIS — Z510 Encounter for antineoplastic radiation therapy: Secondary | ICD-10-CM | POA: Diagnosis not present

## 2023-06-04 DIAGNOSIS — Z170 Estrogen receptor positive status [ER+]: Secondary | ICD-10-CM | POA: Diagnosis not present

## 2023-06-04 DIAGNOSIS — D0511 Intraductal carcinoma in situ of right breast: Secondary | ICD-10-CM | POA: Diagnosis not present

## 2023-06-04 LAB — RAD ONC ARIA SESSION SUMMARY
Course Elapsed Days: 26
Plan Fractions Treated to Date: 2
Plan Prescribed Dose Per Fraction: 2 Gy
Plan Total Fractions Prescribed: 5
Plan Total Prescribed Dose: 10 Gy
Reference Point Dosage Given to Date: 4 Gy
Reference Point Session Dosage Given: 2 Gy
Session Number: 18

## 2023-06-04 MED ORDER — ANASTROZOLE 1 MG PO TABS: 1.0000 mg | ORAL_TABLET | Freq: Every day | ORAL | 3 refills | Status: DC

## 2023-06-04 NOTE — Telephone Encounter (Signed)
Pt had radiation last one today and needed to start on AI- pt . I told her that she needs to wait for 1 week from stopping the radiation and starting the AI- she wanted arimidex and I sent order in. Pt has the side effects and the calcium and vit. D on the paper with Arimidex . I went over the 1200 mg  calcium and vit d 800 units. She is on some now and she will check to see if se needs to add more on to make the correct dose

## 2023-06-05 ENCOUNTER — Ambulatory Visit
Admission: RE | Admit: 2023-06-05 | Discharge: 2023-06-05 | Disposition: A | Discharge status: Home / Self Care | Payer: Medicare PPO | Source: Ambulatory Visit | Attending: Radiation Oncology | Admitting: Radiation Oncology

## 2023-06-05 ENCOUNTER — Other Ambulatory Visit: Payer: Self-pay

## 2023-06-05 DIAGNOSIS — D0511 Intraductal carcinoma in situ of right breast: Secondary | ICD-10-CM | POA: Diagnosis not present

## 2023-06-05 DIAGNOSIS — Z170 Estrogen receptor positive status [ER+]: Secondary | ICD-10-CM | POA: Diagnosis not present

## 2023-06-05 DIAGNOSIS — Z510 Encounter for antineoplastic radiation therapy: Secondary | ICD-10-CM | POA: Diagnosis not present

## 2023-06-05 LAB — RAD ONC ARIA SESSION SUMMARY
Course Elapsed Days: 27
Plan Fractions Treated to Date: 3
Plan Prescribed Dose Per Fraction: 2 Gy
Plan Total Fractions Prescribed: 5
Plan Total Prescribed Dose: 10 Gy
Reference Point Dosage Given to Date: 6 Gy
Reference Point Session Dosage Given: 2 Gy
Session Number: 19

## 2023-06-06 ENCOUNTER — Other Ambulatory Visit: Payer: Self-pay

## 2023-06-06 ENCOUNTER — Ambulatory Visit
Admission: RE | Admit: 2023-06-06 | Discharge: 2023-06-06 | Disposition: A | Discharge status: Home / Self Care | Payer: Medicare PPO | Source: Ambulatory Visit | Attending: Radiation Oncology | Admitting: Radiation Oncology

## 2023-06-06 DIAGNOSIS — Z170 Estrogen receptor positive status [ER+]: Secondary | ICD-10-CM | POA: Diagnosis not present

## 2023-06-06 DIAGNOSIS — D0511 Intraductal carcinoma in situ of right breast: Secondary | ICD-10-CM | POA: Diagnosis not present

## 2023-06-06 DIAGNOSIS — Z510 Encounter for antineoplastic radiation therapy: Secondary | ICD-10-CM | POA: Diagnosis not present

## 2023-06-06 LAB — RAD ONC ARIA SESSION SUMMARY
Course Elapsed Days: 28
Plan Fractions Treated to Date: 4
Plan Prescribed Dose Per Fraction: 2 Gy
Plan Total Fractions Prescribed: 5
Plan Total Prescribed Dose: 10 Gy
Reference Point Dosage Given to Date: 8 Gy
Reference Point Session Dosage Given: 2 Gy
Session Number: 20

## 2023-06-07 ENCOUNTER — Other Ambulatory Visit: Payer: Self-pay

## 2023-06-07 ENCOUNTER — Encounter: Payer: Self-pay | Admitting: *Deleted

## 2023-06-07 ENCOUNTER — Ambulatory Visit
Admission: RE | Admit: 2023-06-07 | Discharge: 2023-06-07 | Disposition: A | Discharge status: Home / Self Care | Payer: Medicare PPO | Source: Ambulatory Visit | Attending: Radiation Oncology | Admitting: Radiation Oncology

## 2023-06-07 DIAGNOSIS — Z510 Encounter for antineoplastic radiation therapy: Secondary | ICD-10-CM | POA: Diagnosis not present

## 2023-06-07 DIAGNOSIS — Z170 Estrogen receptor positive status [ER+]: Secondary | ICD-10-CM | POA: Diagnosis not present

## 2023-06-07 DIAGNOSIS — D0511 Intraductal carcinoma in situ of right breast: Secondary | ICD-10-CM | POA: Diagnosis not present

## 2023-06-07 LAB — RAD ONC ARIA SESSION SUMMARY
Course Elapsed Days: 29
Plan Fractions Treated to Date: 5
Plan Prescribed Dose Per Fraction: 2 Gy
Plan Total Fractions Prescribed: 5
Plan Total Prescribed Dose: 10 Gy
Reference Point Dosage Given to Date: 10 Gy
Reference Point Session Dosage Given: 2 Gy
Session Number: 21

## 2023-06-13 ENCOUNTER — Inpatient Hospital Stay: Payer: Medicare PPO | Admitting: Hospice and Palliative Medicine

## 2023-06-13 DIAGNOSIS — D0511 Intraductal carcinoma in situ of right breast: Secondary | ICD-10-CM

## 2023-06-13 NOTE — Progress Notes (Signed)
Multidisciplinary Oncology Council Documentation  Gloris Shiroma was presented by our [facility] on 06/13/2023, which included representatives from:  Palliative Care Dietitian  Physical/Occupational Therapist Nurse Navigator Genetics Social work Survivorship RN Financial Navigator Research RN   Tenille currently presents with history of DCIS  We reviewed previous medical and familial history, history of present illness, and recent lab results along with all available histopathologic and imaging studies. The MOC considered available treatment options and made the following recommendations/referrals:  Rehab screening, genetics, SW  The MOC is a meeting of clinicians from various specialty areas who evaluate and discuss patients for whom a multidisciplinary approach is being considered. Final determinations in the plan of care are those of the provider(s).   Today's extended care, comprehensive team conference, Lisha was not present for the discussion and was not examined.

## 2023-06-15 ENCOUNTER — Inpatient Hospital Stay: Payer: Medicare PPO

## 2023-06-15 NOTE — Progress Notes (Signed)
[facility] Clinical Social Work  Clinical Social Work was referred by medical provider for assessment of psychosocial needs.  Clinical Social Worker contacted patient by phone to offer support and assess for needs.    Patient stated she was doing well since her last radiation treatment on 7/11.  She received appointment requests through MyChart and  wishes to know if they are necessary.  She stated she had no needs at the present.  Patient is a retired Public house manager and expressed being familiar with the request for continued care.     Dorothey Baseman, LCSW  Clinical Social Worker [facility]

## 2023-06-18 ENCOUNTER — Encounter: Payer: Self-pay | Admitting: Oncology

## 2023-06-18 ENCOUNTER — Encounter: Payer: Self-pay | Admitting: Occupational Therapy

## 2023-06-27 ENCOUNTER — Ambulatory Visit: Payer: Medicare PPO | Admitting: Occupational Therapy

## 2023-07-03 ENCOUNTER — Encounter: Payer: Medicare PPO | Admitting: Licensed Clinical Social Worker

## 2023-07-03 ENCOUNTER — Other Ambulatory Visit: Payer: Medicare PPO

## 2023-07-12 ENCOUNTER — Ambulatory Visit
Admission: RE | Admit: 2023-07-12 | Discharge: 2023-07-12 | Disposition: A | Discharge status: Home / Self Care | Payer: Medicare PPO | Source: Ambulatory Visit | Attending: Radiation Oncology | Admitting: Radiation Oncology

## 2023-07-12 ENCOUNTER — Encounter: Payer: Self-pay | Admitting: Radiation Oncology

## 2023-07-12 VITALS — BP 135/85 | HR 77 | Temp 98.6°F | Resp 20 | Wt 111.4 lb

## 2023-07-12 DIAGNOSIS — D0511 Intraductal carcinoma in situ of right breast: Secondary | ICD-10-CM | POA: Diagnosis not present

## 2023-07-12 NOTE — Progress Notes (Signed)
Radiation Oncology Follow up Note  Name: Sandra Franco   Date:   07/12/2023 MRN:  [id number] DOB: [birth year]    This 72 y.o. female presents to the clinic today for 1 month follow-up status post whole breast radiation to her right breast for ductal carcinoma in situ ER positive.  REFERRING PROVIDER: Alfredia Ferguson, PA-C  HPI: Patient is a 72 year old female now out 1 month having completed whole breast radiation to her right breast for ER positive ductal carcinoma in situ.  Seen today in routine follow-up she is doing well.  She specifically denies breast tenderness cough or bone pain..  She has been started on Arimidex tolerating it well except for some minor hot flashes.  COMPLICATIONS OF TREATMENT: none  FOLLOW UP COMPLIANCE: keeps appointments   PHYSICAL EXAM:  BP 135/85   Pulse 77   Temp 98.6 F (37 C)   Resp 20   Wt 111 lb 6.4 oz (50.5 kg)   SpO2 100%   BMI 18.54 kg/m  Lungs are clear to A&P cardiac examination essentially unremarkable with regular rate and rhythm. No dominant mass or nodularity is noted in either breast in 2 positions examined. Incision is well-healed. No axillary or supraclavicular adenopathy is appreciated. Cosmetic result is excellent.  Well-developed well-nourished patient in NAD. HEENT reveals PERLA, EOMI, discs not visualized.  Oral cavity is clear. No oral mucosal lesions are identified. Neck is clear without evidence of cervical or supraclavicular adenopathy. Lungs are clear to A&P. Cardiac examination is essentially unremarkable with regular rate and rhythm without murmur rub or thrill. Abdomen is benign with no organomegaly or masses noted. Motor sensory and DTR levels are equal and symmetric in the upper and lower extremities. Cranial nerves II through XII are grossly intact. Proprioception is intact. No peripheral adenopathy or edema is identified. No motor or sensory levels are noted. Crude visual fields are within normal range.  RADIOLOGY RESULTS: No  current films for review  PLAN: Time patient is doing well 1 month out from whole breast radiation and pleased with her overall progress.  Of asked to see her back in 6 months for follow-up.  She continues close care by medical oncology.  She continues on Arimidex without side effect.  Patient knows to call with any concerns.  I would like to take this opportunity to thank you for allowing me to participate in the care of your patient.Carmina Miller, MD

## 2023-07-26 ENCOUNTER — Encounter: Payer: Self-pay | Admitting: Oncology

## 2023-07-31 ENCOUNTER — Encounter: Payer: Self-pay | Admitting: Oncology

## 2023-07-31 ENCOUNTER — Inpatient Hospital Stay: Payer: Medicare PPO | Attending: Oncology

## 2023-07-31 ENCOUNTER — Inpatient Hospital Stay (HOSPITAL_BASED_OUTPATIENT_CLINIC_OR_DEPARTMENT_OTHER): Payer: Medicare PPO | Admitting: Oncology

## 2023-07-31 VITALS — BP 142/87 | HR 72 | Temp 95.8°F | Resp 18 | Ht 65.0 in | Wt 112.2 lb

## 2023-07-31 DIAGNOSIS — R232 Flushing: Secondary | ICD-10-CM | POA: Diagnosis not present

## 2023-07-31 DIAGNOSIS — D0511 Intraductal carcinoma in situ of right breast: Secondary | ICD-10-CM | POA: Diagnosis not present

## 2023-07-31 DIAGNOSIS — Z79811 Long term (current) use of aromatase inhibitors: Secondary | ICD-10-CM | POA: Diagnosis not present

## 2023-07-31 DIAGNOSIS — Z8616 Personal history of COVID-19: Secondary | ICD-10-CM | POA: Diagnosis not present

## 2023-07-31 DIAGNOSIS — Z170 Estrogen receptor positive status [ER+]: Secondary | ICD-10-CM | POA: Diagnosis not present

## 2023-07-31 DIAGNOSIS — Z5181 Encounter for therapeutic drug level monitoring: Secondary | ICD-10-CM

## 2023-07-31 DIAGNOSIS — Z801 Family history of malignant neoplasm of trachea, bronchus and lung: Secondary | ICD-10-CM | POA: Insufficient documentation

## 2023-07-31 DIAGNOSIS — M8580 Other specified disorders of bone density and structure, unspecified site: Secondary | ICD-10-CM | POA: Insufficient documentation

## 2023-07-31 DIAGNOSIS — Z79899 Other long term (current) drug therapy: Secondary | ICD-10-CM | POA: Insufficient documentation

## 2023-07-31 DIAGNOSIS — Z86010 Personal history of colonic polyps: Secondary | ICD-10-CM | POA: Diagnosis not present

## 2023-07-31 DIAGNOSIS — F17210 Nicotine dependence, cigarettes, uncomplicated: Secondary | ICD-10-CM | POA: Diagnosis not present

## 2023-07-31 DIAGNOSIS — J449 Chronic obstructive pulmonary disease, unspecified: Secondary | ICD-10-CM | POA: Diagnosis not present

## 2023-07-31 DIAGNOSIS — D696 Thrombocytopenia, unspecified: Secondary | ICD-10-CM | POA: Diagnosis not present

## 2023-07-31 LAB — COMPREHENSIVE METABOLIC PANEL
ALT: 25 U/L (ref 0–44)
AST: 29 U/L (ref 15–41)
Albumin: 4.1 g/dL (ref 3.5–5.0)
Alkaline Phosphatase: 68 U/L (ref 38–126)
Anion gap: 8 (ref 5–15)
BUN: 11 mg/dL (ref 8–23)
CO2: 29 mmol/L (ref 22–32)
Calcium: 10.1 mg/dL (ref 8.9–10.3)
Chloride: 99 mmol/L (ref 98–111)
Creatinine, Ser: 0.73 mg/dL (ref 0.44–1.00)
GFR, Estimated: >60 mL/min (ref >60)
Glucose, Bld: 93 mg/dL (ref 70–99)
Potassium: 4.1 mmol/L (ref 3.5–5.1)
Sodium: 136 mmol/L (ref 135–145)
Total Bilirubin: 0.6 mg/dL (ref 0.3–1.2)
Total Protein: 7 g/dL (ref 6.5–8.1)

## 2023-08-04 NOTE — Progress Notes (Signed)
Hematology/Oncology Consult note [facility]  Telephone:([phone] [phone]  Patient Care Team: Alfredia Ferguson, PA-C as PCP - General (Physician Assistant) Ponciano Ort, [facility], Armando Reichert, [place] as Oncology Nurse Navigator Creig Hines, MD as Consulting Physician (Oncology)   Name of the patient: Sandra Franco  [id number]  Sep 08, 1951   Date of visit: 08/04/23  Diagnosis- right breast DCIS  Chief complaint/ Reason for visit- routine f/u of right breast DCIS  Heme/Onc history: Patient is a 72 year old female who was 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  Final pathology showed 15 mm grade 2 DCIS with negative margins.  ER more than 90% positive.  She had lumpectomy followed by adjuvant radiation therapy and started Arimidex in June 2024.  She has baseline osteopenia  Interval history-tolerating Arimidex well.  She reports occasional self-limited hot flashes  ECOG PS- 1 Pain scale- 0  Review of systems- Review of Systems  Constitutional:  Negative for chills, fever, malaise/fatigue and weight loss.  HENT:  Negative for congestion, ear discharge and nosebleeds.   Eyes:  Negative for blurred vision.  Respiratory:  Negative for cough, hemoptysis, sputum production, shortness of breath and wheezing.   Cardiovascular:  Negative for chest pain, palpitations, orthopnea and claudication.  Gastrointestinal:  Negative for abdominal pain, blood in stool, constipation, diarrhea, heartburn, melena, nausea and vomiting.  Genitourinary:  Negative for dysuria, flank pain, frequency, hematuria and urgency.  Musculoskeletal:  Negative for back pain, joint pain and myalgias.  Skin:  Negative for rash.  Neurological:  Negative for dizziness, tingling, focal weakness, seizures, weakness and headaches.  Endo/Heme/Allergies:  Does not bruise/bleed easily.  Psychiatric/Behavioral:  Negative for depression and suicidal ideas. The patient  does not have insomnia.       Allergies  Allergen Reactions   Amoxicillin Rash   Cefdinir Rash   Codeine Nausea Only     Past Medical History:  Diagnosis Date   COPD (chronic obstructive pulmonary disease) (HCC)    COVID-19    Depression    Heavy alcohol consumption    Mood disorder (HCC)    Osteopenia    Thrombocytopenia (HCC)    Tobacco use disorder    Tubular adenoma of colon 2019   needs another colonoscopy in 3 years     Past Surgical History:  Procedure Laterality Date   BREAST BIOPSY Right 2014   NEG   BREAST BIOPSY Right 02/27/2023   Right Breast Bx, Ribbon clip - path pending   BREAST BIOPSY Right 02/27/2023   MM RT BREAST BX W LOC DEV 1ST LESION IMAGE BX SPEC STEREO GUIDE 02/27/2023 [facility]   BREAST BIOPSY Right 03/21/2023   Stereo Bx, Coil Clip - path pending   BREAST BIOPSY Right 03/21/2023   Stereo Bx, Ribbon Clip - path pending   BREAST BIOPSY Right 03/21/2023   MM RT BREAST BX W LOC DEV 1ST LESION IMAGE BX SPEC STEREO GUIDE 03/21/2023 [facility]   BREAST BIOPSY Right 03/21/2023   MM RT BREAST BX W LOC DEV EA AD LESION IMG BX SPEC STEREO GUIDE 03/21/2023 [facility]   BREAST BIOPSY Right 04/05/2023   MM RT RADIO FREQUENCY TAG LOC MAMMO GUIDE 04/05/2023 [facility]   BREAST LUMPECTOMY WITH RADIOFREQUENCY TAG IDENTIFICATION Right 04/18/2023   Procedure: BREAST LUMPECTOMY WITH RADIOFREQUENCY TAG IDENTIFICATION;  Surgeon: Campbell Lerner, MD;  Location: [facility];  Service: General;  Laterality: Right;   COLONOSCOPY  09/23/2012   negative   COLONOSCOPY WITH PROPOFOL  N/A 08/09/2018   Procedure: COLONOSCOPY WITH PROPOFOL;  Surgeon: Scot Jun, MD;  Location: [facility];  Service: Endoscopy;  Laterality: N/A;   FINGER FRACTURE SURGERY Right 1984   INDUCED ABORTION     TONSILLECTOMY      Social History   Socioeconomic History   Marital status: Married    Spouse name: Ruffin   Number of children: 1   Years of education: LPN    Highest education level: Some college, no degree  Occupational History   Occupation: retired Geologist, engineering  Tobacco Use   Smoking status: Every Day    Current packs/day: 0.50    Average packs/day: 0.5 packs/day for 51.0 years (25.5 ttl pk-yrs)    Types: Cigarettes    Passive exposure: Past   Smokeless tobacco: Never  Vaping Use   Vaping status: Never Used  Substance and Sexual Activity   Alcohol use: Yes    Alcohol/week: 14.0 - 21.0 standard drinks of alcohol    Types: 14 - 21 Cans of beer per week    Comment: 2-3   Drug use: No   Sexual activity: Not Currently    Birth control/protection: Post-menopausal  Other Topics Concern   Not on file  Social History Narrative   Not on file   Social Determinants of Health   Financial Resource Strain: Low Risk  (01/26/2021)   Overall Financial Resource Strain (CARDIA)    Difficulty of Paying Living Expenses: Not hard at all  Food Insecurity: No Food Insecurity (04/30/2023)   Hunger Vital Sign    Worried About Running Out of Food in the Last Year: Never true    Ran Out of Food in the Last Year: Never true  Transportation Needs: No Transportation Needs (04/30/2023)   PRAPARE - Administrator, Civil Service (Medical): No    Lack of Transportation (Non-Medical): No  Physical Activity: Inactive (01/26/2021)   Exercise Vital Sign    Days of Exercise per Week: 0 days    Minutes of Exercise per Session: 0 min  Stress: Stress Concern Present (01/26/2021)   [employer] of Occupational Health - Occupational Stress Questionnaire    Feeling of Stress : To some extent  Social Connections: Moderately Isolated (01/26/2021)   Social Connection and Isolation Panel [NHANES]    Frequency of Communication with Friends and Family: Twice a week    Frequency of Social Gatherings with Friends and Family: More than three times a week    Attends Religious Services: Never    Database administrator or Organizations: No    Attends Tax inspector Meetings: Never    Marital Status: Married  Catering manager Violence: Not At Risk (04/30/2023)   Humiliation, Afraid, Rape, and Kick questionnaire    Fear of Current or Ex-Partner: No    Emotionally Abused: No    Physically Abused: No    Sexually Abused: No    Family History  Problem Relation Age of Onset   Rheum arthritis Mother    Hypertension Mother    Lung cancer Father 44   Pancreatic cancer Maternal Aunt 54   Breast cancer Neg Hx      Current Outpatient Medications:    anastrozole (ARIMIDEX) 1 MG tablet, Take 1 tablet (1 mg total) by mouth daily., Disp: 30 tablet, Rfl: 3   buPROPion (WELLBUTRIN XL) 300 MG 24 hr tablet, Take 1 tablet (300 mg total) by mouth daily., Disp: 90 tablet, Rfl: 0   Calcium Citrate-Vitamin D (CALCIUM +  D PO), Take 1 capsule by mouth daily., Disp: , Rfl:    ibuprofen (ADVIL) 200 MG tablet, Take 200 mg by mouth every 6 (six) hours as needed., Disp: , Rfl:    Multiple Vitamin (MULTIVITAMIN) capsule, Take 1 capsule by mouth daily., Disp: , Rfl:    rosuvastatin (CRESTOR) 5 MG tablet, Take 1 tablet (5 mg total) by mouth daily., Disp: 90 tablet, Rfl: 3  Physical exam:  Vitals:   07/31/23 1111 07/31/23 1114  BP: (!) 146/84 (!) 142/87  Pulse: 69 72  Resp: 18   Temp: (!) 95.8 F (35.4 C)   TempSrc: Tympanic   SpO2: 100%   Weight: 112 lb 3.2 oz (50.9 kg)   Height: 5\' 5"  (1.651 m)    Physical Exam Cardiovascular:     Rate and Rhythm: Normal rate and regular rhythm.     Heart sounds: Normal heart sounds.  Pulmonary:     Effort: Pulmonary effort is normal.     Breath sounds: Normal breath sounds.  Abdominal:     General: Bowel sounds are normal.     Palpations: Abdomen is soft.  Skin:    General: Skin is warm and dry.  Neurological:     Mental Status: She is alert and oriented to person, place, and time.         Latest Ref Rng & Units 07/31/2023   10:41 AM  CMP  Glucose 70 - 99 mg/dL 93   BUN 8 - 23 mg/dL 11   Creatinine 4.09 -  1.00 mg/dL 8.11   Sodium 914 - 782 mmol/L 136   Potassium 3.5 - 5.1 mmol/L 4.1   Chloride 98 - 111 mmol/L 99   CO2 22 - 32 mmol/L 29   Calcium 8.9 - 10.3 mg/dL 95.6   Total Protein 6.5 - 8.1 g/dL 7.0   Total Bilirubin 0.3 - 1.2 mg/dL 0.6   Alkaline Phos 38 - 126 U/L 68   AST 15 - 41 U/L 29   ALT 0 - 44 U/L 25       Latest Ref Rng & Units 05/30/2023    9:33 AM  CBC  WBC 4.0 - 10.5 K/uL 5.3   Hemoglobin 12.0 - 15.0 g/dL 21.3   Hematocrit 08.6 - 46.0 % 41.2   Platelets 150 - 400 K/uL 216     No images are attached to the encounter.  No results found.   Assessment and plan- Patient is a 72 y.o. female with right breast DCIS on arimidex here for routine f/u  Clinically patient is doing well with no concerning signs and symptoms of recurrence based on today's exam.  She is tolerating Arimidex well.  Her baseline bone density scan in 2018 showed osteopenia and I would like to get a repeat bone density scan at this time.  I will see her back in 3 months no labs. We will schedule her mammogram for march 2024.   Visit Diagnosis 1. Ductal carcinoma in situ (DCIS) of right breast   2. High risk medication use      Dr. Owens Shark, MD, MPH [facility] [phone] 08/04/2023 3:35 PM

## 2023-08-23 ENCOUNTER — Other Ambulatory Visit: Payer: Self-pay

## 2023-08-23 DIAGNOSIS — D0511 Intraductal carcinoma in situ of right breast: Secondary | ICD-10-CM

## 2023-08-25 ENCOUNTER — Encounter: Payer: Self-pay | Admitting: Surgery

## 2023-09-13 ENCOUNTER — Ambulatory Visit
Admission: RE | Admit: 2023-09-13 | Discharge: 2023-09-13 | Disposition: A | Discharge status: Home / Self Care | Payer: Medicare PPO | Source: Ambulatory Visit | Attending: Oncology | Admitting: Oncology

## 2023-09-13 DIAGNOSIS — Z780 Asymptomatic menopausal state: Secondary | ICD-10-CM | POA: Insufficient documentation

## 2023-09-13 DIAGNOSIS — Z79899 Other long term (current) drug therapy: Secondary | ICD-10-CM | POA: Insufficient documentation

## 2023-09-13 DIAGNOSIS — M8580 Other specified disorders of bone density and structure, unspecified site: Secondary | ICD-10-CM | POA: Diagnosis not present

## 2023-09-13 DIAGNOSIS — D0511 Intraductal carcinoma in situ of right breast: Secondary | ICD-10-CM | POA: Diagnosis not present

## 2023-09-13 DIAGNOSIS — M810 Age-related osteoporosis without current pathological fracture: Secondary | ICD-10-CM | POA: Diagnosis not present

## 2023-09-18 ENCOUNTER — Encounter: Payer: Self-pay | Admitting: Surgery

## 2023-09-29 ENCOUNTER — Other Ambulatory Visit: Payer: Self-pay | Admitting: Oncology

## 2023-10-03 ENCOUNTER — Encounter: Payer: Self-pay | Admitting: Oncology

## 2023-10-03 ENCOUNTER — Other Ambulatory Visit: Payer: Self-pay | Admitting: *Deleted

## 2023-10-03 NOTE — Progress Notes (Signed)
Duplicate , so I do not need this encounter

## 2023-10-04 ENCOUNTER — Other Ambulatory Visit: Payer: Self-pay | Admitting: *Deleted

## 2023-10-04 MED ORDER — ANASTROZOLE 1 MG PO TABS: 1.0000 mg | ORAL_TABLET | Freq: Every day | ORAL | 3 refills | Status: DC

## 2023-10-09 ENCOUNTER — Ambulatory Visit: Payer: Medicare PPO | Admitting: Surgery

## 2023-10-30 ENCOUNTER — Inpatient Hospital Stay: Payer: Medicare PPO | Attending: Oncology | Admitting: Oncology

## 2023-10-30 ENCOUNTER — Encounter: Payer: Self-pay | Admitting: Oncology

## 2023-10-30 VITALS — BP 141/85 | HR 81 | Temp 94.3°F | Resp 18 | Ht 65.0 in | Wt 111.1 lb

## 2023-10-30 DIAGNOSIS — Z170 Estrogen receptor positive status [ER+]: Secondary | ICD-10-CM | POA: Insufficient documentation

## 2023-10-30 DIAGNOSIS — M810 Age-related osteoporosis without current pathological fracture: Secondary | ICD-10-CM | POA: Insufficient documentation

## 2023-10-30 DIAGNOSIS — Z79811 Long term (current) use of aromatase inhibitors: Secondary | ICD-10-CM | POA: Insufficient documentation

## 2023-10-30 DIAGNOSIS — D0511 Intraductal carcinoma in situ of right breast: Secondary | ICD-10-CM | POA: Insufficient documentation

## 2023-10-30 DIAGNOSIS — Z86000 Personal history of in-situ neoplasm of breast: Secondary | ICD-10-CM

## 2023-10-30 DIAGNOSIS — Z8616 Personal history of COVID-19: Secondary | ICD-10-CM | POA: Diagnosis not present

## 2023-10-30 DIAGNOSIS — Z08 Encounter for follow-up examination after completed treatment for malignant neoplasm: Secondary | ICD-10-CM

## 2023-10-30 DIAGNOSIS — Z5181 Encounter for therapeutic drug level monitoring: Secondary | ICD-10-CM

## 2023-10-30 MED ORDER — ALENDRONATE SODIUM 70 MG PO TABS: 70.0000 mg | ORAL_TABLET | ORAL | 6 refills | Status: DC

## 2023-10-30 NOTE — Progress Notes (Signed)
Hematology/Oncology Consult note [facility]  [phone] [phone]  Patient Care Team: Ronnald Ramp, MD as PCP - General ([facility], [facility], Armando Reichert, [place] as Oncology Nurse Navigator Creig Hines, MD as Consulting Physician (Oncology)   Name of the patient: Sandra Franco  [id number]  01/12/1951   Date of visit: 10/30/23  Diagnosis-right breast DCIS ER positive  Chief complaint/ Reason for visit-routine follow-up of DCIS  Heme/Onc history: Patient is a 72 year old female who was 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 Final pathology showed 15 mm grade 2 DCIS with negative margins. ER more than 90% positive. She had lumpectomy followed by adjuvant radiation therapy and started Arimidex in June 2024.   Interval history-she reports occasional hot flashes with Arimidex.  Also feels that her hair is thinning out more.  ECOG PS- 1 Pain scale- 0   Review of systems- Review of Systems  Constitutional:  Negative for chills, fever, malaise/fatigue and weight loss.  HENT:  Negative for congestion, ear discharge and nosebleeds.   Eyes:  Negative for blurred vision.  Respiratory:  Negative for cough, hemoptysis, sputum production, shortness of breath and wheezing.   Cardiovascular:  Negative for chest pain, palpitations, orthopnea and claudication.  Gastrointestinal:  Negative for abdominal pain, blood in stool, constipation, diarrhea, heartburn, melena, nausea and vomiting.  Genitourinary:  Negative for dysuria, flank pain, frequency, hematuria and urgency.  Musculoskeletal:  Negative for back pain, joint pain and myalgias.  Skin:  Negative for rash.  Neurological:  Negative for dizziness, tingling, focal weakness, seizures, weakness and headaches.  Endo/Heme/Allergies:  Does not bruise/bleed easily.  Psychiatric/Behavioral:  Negative for depression and suicidal ideas. The  patient does not have insomnia.       Allergies  Allergen Reactions   Amoxicillin Rash   Cefdinir Rash   Codeine Nausea Only     Past Medical History:  Diagnosis Date   COPD (chronic obstructive pulmonary disease) (HCC)    COVID-19    Depression    Heavy alcohol consumption    Mood disorder (HCC)    Osteopenia    Thrombocytopenia (HCC)    Tobacco use disorder    Tubular adenoma of colon 2019   needs another colonoscopy in 3 years     Past Surgical History:  Procedure Laterality Date   BREAST BIOPSY Right 2014   NEG   BREAST BIOPSY Right 02/27/2023   Right Breast Bx, Ribbon clip - path pending   BREAST BIOPSY Right 02/27/2023   MM RT BREAST BX W LOC DEV 1ST LESION IMAGE BX SPEC STEREO GUIDE 02/27/2023 [facility]   BREAST BIOPSY Right 03/21/2023   Stereo Bx, Coil Clip - path pending   BREAST BIOPSY Right 03/21/2023   Stereo Bx, Ribbon Clip - path pending   BREAST BIOPSY Right 03/21/2023   MM RT BREAST BX W LOC DEV 1ST LESION IMAGE BX SPEC STEREO GUIDE 03/21/2023 [facility]   BREAST BIOPSY Right 03/21/2023   MM RT BREAST BX W LOC DEV EA AD LESION IMG BX SPEC STEREO GUIDE 03/21/2023 [facility]   BREAST BIOPSY Right 04/05/2023   MM RT RADIO FREQUENCY TAG LOC MAMMO GUIDE 04/05/2023 [facility]   BREAST LUMPECTOMY WITH RADIOFREQUENCY TAG IDENTIFICATION Right 04/18/2023   Procedure: BREAST LUMPECTOMY WITH RADIOFREQUENCY TAG IDENTIFICATION;  Surgeon: Campbell Lerner, MD;  Location: [facility];  Service: General;  Laterality: Right;   COLONOSCOPY  09/23/2012   negative   COLONOSCOPY WITH PROPOFOL N/A  08/09/2018   Procedure: COLONOSCOPY WITH PROPOFOL;  Surgeon: Scot Jun, MD;  Location: [facility];  Service: Endoscopy;  Laterality: N/A;   FINGER FRACTURE SURGERY Right 1984   INDUCED ABORTION     TONSILLECTOMY      Social History   Socioeconomic History   Marital status: Married    Spouse name: Ruffin   Number of children: 1   Years of  education: LPN   Highest education level: Some college, no degree  Occupational History   Occupation: retired Geologist, engineering  Tobacco Use   Smoking status: Every Day    Current packs/day: 0.50    Average packs/day: 0.5 packs/day for 51.0 years (25.5 ttl pk-yrs)    Types: Cigarettes    Passive exposure: Past   Smokeless tobacco: Never  Vaping Use   Vaping status: Never Used  Substance and Sexual Activity   Alcohol use: Yes    Alcohol/week: 14.0 - 21.0 standard drinks of alcohol    Types: 14 - 21 Cans of beer per week    Comment: 2-3   Drug use: No   Sexual activity: Not Currently    Birth control/protection: Post-menopausal  Other Topics Concern   Not on file  Social History Narrative   Not on file   Social Determinants of Health   Financial Resource Strain: Low Risk  (01/26/2021)   Overall Financial Resource Strain (CARDIA)    Difficulty of Paying Living Expenses: Not hard at all  Food Insecurity: No Food Insecurity (04/30/2023)   Hunger Vital Sign    Worried About Running Out of Food in the Last Year: Never true    Ran Out of Food in the Last Year: Never true  Transportation Needs: No Transportation Needs (04/30/2023)   PRAPARE - Administrator, Civil Service (Medical): No    Lack of Transportation (Non-Medical): No  Physical Activity: Inactive (01/26/2021)   Exercise Vital Sign    Days of Exercise per Week: 0 days    Minutes of Exercise per Session: 0 min  Stress: Stress Concern Present (01/26/2021)   [employer] of Occupational Health - Occupational Stress Questionnaire    Feeling of Stress : To some extent  Social Connections: Moderately Isolated (01/26/2021)   Social Connection and Isolation Panel [NHANES]    Frequency of Communication with Friends and Family: Twice a week    Frequency of Social Gatherings with Friends and Family: More than three times a week    Attends Religious Services: Never    Database administrator or Organizations: No    Attends  Banker Meetings: Never    Marital Status: Married  Catering manager Violence: Not At Risk (04/30/2023)   Humiliation, Afraid, Rape, and Kick questionnaire    Fear of Current or Ex-Partner: No    Emotionally Abused: No    Physically Abused: No    Sexually Abused: No    Family History  Problem Relation Age of Onset   Rheum arthritis Mother    Hypertension Mother    Lung cancer Father 24   Pancreatic cancer Maternal Aunt 36   Breast cancer Neg Hx      Current Outpatient Medications:    alendronate (FOSAMAX) 70 MG tablet, Take 1 tablet (70 mg total) by mouth once a week. Take with a full glass of water on an empty stomach., Disp: 4 tablet, Rfl: 6   anastrozole (ARIMIDEX) 1 MG tablet, Take 1 tablet (1 mg total) by mouth daily., Disp: 30 tablet,  Rfl: 3   buPROPion (WELLBUTRIN XL) 300 MG 24 hr tablet, Take 1 tablet (300 mg total) by mouth daily., Disp: 90 tablet, Rfl: 0   Calcium Citrate-Vitamin D (CALCIUM + D PO), Take 1 capsule by mouth daily., Disp: , Rfl:    ibuprofen (ADVIL) 200 MG tablet, Take 200 mg by mouth every 6 (six) hours as needed., Disp: , Rfl:    Multiple Vitamin (MULTIVITAMIN) capsule, Take 1 capsule by mouth daily., Disp: , Rfl:    rosuvastatin (CRESTOR) 5 MG tablet, Take 1 tablet (5 mg total) by mouth daily., Disp: 90 tablet, Rfl: 3  Physical exam:  Vitals:   10/30/23 0941  BP: (!) 141/85  Pulse: 81  Resp: 18  Temp: (!) 94.3 F (34.6 C)  TempSrc: Tympanic  SpO2: 100%  Weight: 111 lb 1.6 oz (50.4 kg)  Height: 5\' 5"  (1.651 m)   Physical Exam Cardiovascular:     Rate and Rhythm: Normal rate and regular rhythm.     Heart sounds: Normal heart sounds.  Pulmonary:     Effort: Pulmonary effort is normal.  Skin:    General: Skin is warm and dry.  Neurological:     Mental Status: She is alert and oriented to person, place, and time.         Latest Ref Rng & Units 07/31/2023   10:41 AM  CMP  Glucose 70 - 99 mg/dL 93   BUN 8 - 23 mg/dL 11    Creatinine 3.66 - 1.00 mg/dL 4.40   Sodium 347 - 425 mmol/L 136   Potassium 3.5 - 5.1 mmol/L 4.1   Chloride 98 - 111 mmol/L 99   CO2 22 - 32 mmol/L 29   Calcium 8.9 - 10.3 mg/dL 95.6   Total Protein 6.5 - 8.1 g/dL 7.0   Total Bilirubin 0.3 - 1.2 mg/dL 0.6   Alkaline Phos 38 - 126 U/L 68   AST 15 - 41 U/L 29   ALT 0 - 44 U/L 25       Latest Ref Rng & Units 05/30/2023    9:33 AM  CBC  WBC 4.0 - 10.5 K/uL 5.3   Hemoglobin 12.0 - 15.0 g/dL 38.7   Hematocrit 56.4 - 46.0 % 41.2   Platelets 150 - 400 K/uL 216     No images are attached to the encounter.  No results found.   Assessment and plan- Patient is a 72 y.o. female with history of right breast DCIS ER positive here to discuss bone density scan results and further management  Discussed the results of bone densityScan which shows osteoporosis involving both her hips and the spine with a T-score of -3.4 noted in her AP spine.  We discussed various management strategies for osteoporosis including weekly Fosamax versus yearly Reclast.  Patient has periodontal disease and is concerned about getting yearly Reclast given the risk of osteonecrosis of the jaw.  She is willing to try weekly Fosamax and I will send her the prescription for the same.  Discussed risks and benefits of Fosamax including all but not limited to possible reflux.  Patient understands and agrees to proceed.  We also discussed either continuing with Arimidex versus switching to tamoxifen versus stopping endocrine therapy altogether for her ER positive DCIS.  Patient understands and would like to continue Arimidex at this time and we will repeat her bone density scan again sometime next year.  I will see her back in 6 months no labs   Visit Diagnosis 1.  Osteoporosis of lumbar spine   2. Visit for monitoring Arimidex therapy   3. Encounter for follow-up surveillance of ductal carcinoma in situ (DCIS) of breast      Dr. Owens Shark, MD, MPH [facility] [phone] 10/30/2023 12:58 PM

## 2023-11-17 ENCOUNTER — Encounter: Payer: Self-pay | Admitting: Oncology

## 2023-11-19 ENCOUNTER — Encounter: Payer: Self-pay | Admitting: Oncology

## 2023-11-20 ENCOUNTER — Other Ambulatory Visit: Payer: Self-pay | Admitting: *Deleted

## 2023-11-20 MED ORDER — ALENDRONATE SODIUM 70 MG PO TABS: 70.0000 mg | ORAL_TABLET | ORAL | 1 refills | Status: DC

## 2023-11-27 DIAGNOSIS — H1045 Other chronic allergic conjunctivitis: Secondary | ICD-10-CM | POA: Diagnosis not present

## 2023-11-27 DIAGNOSIS — M316 Other giant cell arteritis: Secondary | ICD-10-CM | POA: Diagnosis not present

## 2023-11-27 DIAGNOSIS — H5712 Ocular pain, left eye: Secondary | ICD-10-CM | POA: Diagnosis not present

## 2023-12-07 ENCOUNTER — Encounter: Payer: Self-pay | Admitting: Oncology

## 2023-12-26 DIAGNOSIS — H1045 Other chronic allergic conjunctivitis: Secondary | ICD-10-CM | POA: Diagnosis not present

## 2023-12-26 DIAGNOSIS — H2513 Age-related nuclear cataract, bilateral: Secondary | ICD-10-CM | POA: Diagnosis not present

## 2023-12-26 DIAGNOSIS — D23122 Other benign neoplasm of skin of left lower eyelid, including canthus: Secondary | ICD-10-CM | POA: Diagnosis not present

## 2023-12-26 DIAGNOSIS — R519 Headache, unspecified: Secondary | ICD-10-CM | POA: Diagnosis not present

## 2023-12-26 DIAGNOSIS — H43811 Vitreous degeneration, right eye: Secondary | ICD-10-CM | POA: Diagnosis not present

## 2023-12-26 DIAGNOSIS — H524 Presbyopia: Secondary | ICD-10-CM | POA: Diagnosis not present

## 2024-01-01 ENCOUNTER — Encounter: Payer: Self-pay | Admitting: Family Medicine

## 2024-01-01 ENCOUNTER — Ambulatory Visit (INDEPENDENT_AMBULATORY_CARE_PROVIDER_SITE_OTHER): Payer: Medicare PPO | Admitting: Family Medicine

## 2024-01-01 VITALS — BP 144/87 | HR 80 | Resp 20 | Ht 65.0 in | Wt 111.2 lb

## 2024-01-01 DIAGNOSIS — Z13220 Encounter for screening for lipoid disorders: Secondary | ICD-10-CM

## 2024-01-01 DIAGNOSIS — Z0000 Encounter for general adult medical examination without abnormal findings: Secondary | ICD-10-CM

## 2024-01-01 DIAGNOSIS — Z131 Encounter for screening for diabetes mellitus: Secondary | ICD-10-CM

## 2024-01-01 DIAGNOSIS — Z0001 Encounter for general adult medical examination with abnormal findings: Secondary | ICD-10-CM | POA: Diagnosis not present

## 2024-01-01 DIAGNOSIS — Z23 Encounter for immunization: Secondary | ICD-10-CM | POA: Diagnosis not present

## 2024-01-01 DIAGNOSIS — D696 Thrombocytopenia, unspecified: Secondary | ICD-10-CM

## 2024-01-01 DIAGNOSIS — F17200 Nicotine dependence, unspecified, uncomplicated: Secondary | ICD-10-CM

## 2024-01-01 DIAGNOSIS — M810 Age-related osteoporosis without current pathological fracture: Secondary | ICD-10-CM

## 2024-01-01 DIAGNOSIS — F1090 Alcohol use, unspecified, uncomplicated: Secondary | ICD-10-CM

## 2024-01-01 DIAGNOSIS — R030 Elevated blood-pressure reading, without diagnosis of hypertension: Secondary | ICD-10-CM

## 2024-01-01 DIAGNOSIS — M8589 Other specified disorders of bone density and structure, multiple sites: Secondary | ICD-10-CM

## 2024-01-01 DIAGNOSIS — Z1211 Encounter for screening for malignant neoplasm of colon: Secondary | ICD-10-CM

## 2024-01-01 DIAGNOSIS — F32A Depression, unspecified: Secondary | ICD-10-CM

## 2024-01-01 NOTE — Patient Instructions (Signed)
 Recommend   Shingrix COVID Tdap   A referral has been placed on your behalf for a colonoscopy. Our referral coordination team or the office you will be visiting will contact you within the next 2 weeks.  If you have not received a phone call within 10 business days please let us  know so that we can check into this for you.  3

## 2024-01-01 NOTE — Progress Notes (Signed)
 Subjective:   Sandra Franco is a 73 y.o. female who presents for Medicare Annual (Subsequent) preventive examination.  Visit Complete: In person  Patient Medicare AWV questionnaire was completed by the patient on 01/01/24 ; I have confirmed that all information answered by patient is correct and no changes since this date.        Objective:    Today's Vitals   01/01/24 0851 01/01/24 0939  BP: (!) 158/91 (!) 144/87  Pulse: 80   Resp: 20   SpO2: 100%   Weight: 111 lb 3.2 oz (50.4 kg)   Height: 5' 5 (1.651 m)    Body mass index is 18.5 kg/m.     01/01/2024    8:57 AM 10/30/2023    9:37 AM 07/31/2023   11:12 AM 07/12/2023   10:05 AM 04/30/2023   10:44 AM 04/18/2023   12:05 PM 04/10/2023    9:40 AM  Advanced Directives  Does Patient Have a Medical Advance Directive? Yes No Yes Yes Yes Yes Yes  Type of Forensic Scientist of [place];Living will Living will;Healthcare Power of [employer] Power of [place];Living will Healthcare Power of [place];Living will Healthcare Power of [place];Living will  Does patient want to make changes to medical advance directive? No - Patient declined   Yes (ED - Information included in AVS)  No - Patient declined No - Patient declined  Copy of Healthcare Power of Attorney in Chart? No - copy requested     No - copy requested No - copy requested  Would patient like information on creating a medical advance directive?  No - Patient declined  Yes (ED - Information included in AVS)       Current Medications (verified) Outpatient Encounter Medications as of 01/01/2024  Medication Sig   alendronate  (FOSAMAX ) 70 MG tablet Take 1 tablet (70 mg total) by mouth once a week. Take with a full glass of water  on an empty stomach.   anastrozole  (ARIMIDEX ) 1 MG tablet Take 1 tablet (1 mg total) by mouth daily.   buPROPion  (WELLBUTRIN  XL) 300 MG 24 hr tablet Take 1 tablet (300 mg total) by mouth daily.   Calcium   Citrate-Vitamin D  (CALCIUM  + D PO) Take 1 capsule by mouth daily.   ibuprofen (ADVIL) 200 MG tablet Take 200 mg by mouth every 6 (six) hours as needed.   Multiple Vitamin (MULTIVITAMIN) capsule Take 1 capsule by mouth daily.   rosuvastatin  (CRESTOR ) 5 MG tablet Take 1 tablet (5 mg total) by mouth daily.   No facility-administered encounter medications on file as of 01/01/2024.    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   Cataract    COPD (chronic obstructive pulmonary disease) (HCC)    COVID-19    Depression    Emphysema of lung (HCC) 2022   Heavy alcohol consumption    Hypertension 2022 white coat syndrome   Mood disorder (HCC)    Osteopenia    Thrombocytopenia (HCC)    Tobacco use disorder    Tubular adenoma of colon 2019   needs another colonoscopy in 3 years   Past Surgical History:  Procedure Laterality Date   BREAST BIOPSY Right 2014   NEG   BREAST BIOPSY Right 02/27/2023   Right Breast Bx, Ribbon  clip - path pending   BREAST BIOPSY Right 02/27/2023   MM RT BREAST BX W LOC DEV 1ST LESION IMAGE BX SPEC STEREO GUIDE 02/27/2023 [facility]   BREAST BIOPSY Right 03/21/2023   Stereo Bx, Coil Clip - path pending   BREAST BIOPSY Right 03/21/2023   Stereo Bx, Ribbon Clip - path pending   BREAST BIOPSY Right 03/21/2023   MM RT BREAST BX W LOC DEV 1ST LESION IMAGE BX SPEC STEREO GUIDE 03/21/2023 [facility]   BREAST BIOPSY Right 03/21/2023   MM RT BREAST BX W LOC DEV EA AD LESION IMG BX SPEC STEREO GUIDE 03/21/2023 [facility]   BREAST BIOPSY Right 04/05/2023   MM RT RADIO FREQUENCY TAG LOC MAMMO GUIDE 04/05/2023 [facility]   BREAST LUMPECTOMY WITH RADIOFREQUENCY TAG IDENTIFICATION Right 04/18/2023   Procedure: BREAST LUMPECTOMY WITH RADIOFREQUENCY TAG  IDENTIFICATION;  Surgeon: Lane Shope, MD;  Location: [facility];  Service: General;  Laterality: Right;   COLONOSCOPY  09/23/2012   negative   COLONOSCOPY WITH PROPOFOL  N/A 08/09/2018   Procedure: COLONOSCOPY WITH PROPOFOL ;  Surgeon: Viktoria Lamar DASEN, MD;  Location: [facility] ENDOSCOPY;  Service: Endoscopy;  Laterality: N/A;   FINGER FRACTURE SURGERY Right 1984   FRACTURE SURGERY  rt.middle finger   INDUCED ABORTION     TONSILLECTOMY     Family History  Problem Relation Age of Onset   Rheum arthritis Mother    Hypertension Mother    Arthritis Mother    Heart disease Mother    Stroke Mother    Varicose Veins Mother    Lung cancer Father 37   Cancer Father    Diabetes Sister    Graves' disease Sister    Pancreatic cancer Maternal Aunt 71   Cancer Maternal Aunt    Diabetes Maternal Uncle    Heart disease Maternal Uncle    Cancer Paternal Aunt    Breast cancer Neg Hx    Social History   Socioeconomic History   Marital status: Married    Spouse name: Ruffin   Number of children: 1   Years of education: LPN   Highest education level: Associate degree: occupational, scientist, product/process development, or vocational program  Occupational History   Occupation: retired geologist, engineering  Tobacco Use   Smoking status: Every Day    Current packs/day: 0.50    Average packs/day: 0.5 packs/day for 51.0 years (25.5 ttl pk-yrs)    Types: Cigarettes    Passive exposure: Past   Smokeless tobacco: Never  Vaping Use   Vaping status: Never Used  Substance and Sexual Activity   Alcohol use: Yes    Alcohol/week: 14.0 standard drinks of alcohol    Types: 14 Cans of beer per week    Comment: 2-3   Drug use: No   Sexual activity: Not Currently    Birth control/protection: Abstinence, Post-menopausal  Other Topics Concern   Not on file  Social History Narrative   Not on file   Social Drivers of Health   Financial Resource Strain: Low Risk  (12/31/2023)   Overall Financial Resource Strain (CARDIA)     Difficulty of Paying Living Expenses: Not hard at all  Food Insecurity: No Food Insecurity (12/31/2023)   Hunger Vital Sign    Worried About Running Out of Food in the Last Year: Never true    Ran Out of Food in the Last Year: Never true  Transportation Needs: No Transportation Needs (12/31/2023)   PRAPARE - Transportation    Lack of Transportation (Medical): No    Lack  of Transportation (Non-Medical): No  Physical Activity: Insufficiently Active (12/31/2023)   Exercise Vital Sign    Days of Exercise per Week: 1 day    Minutes of Exercise per Session: 20 min  Stress: Stress Concern Present (12/31/2023)   [employer] of Occupational Health - Occupational Stress Questionnaire    Feeling of Stress : To some extent  Social Connections: Socially Integrated (12/31/2023)   Social Connection and Isolation Panel [NHANES]    Frequency of Communication with Friends and Family: Twice a week    Frequency of Social Gatherings with Friends and Family: Once a week    Attends Religious Services: 1 to 4 times per year    Active Member of [employer] or Organizations: Yes    Attends Banker Meetings: 1 to 4 times per year    Marital Status: Married    Tobacco Counseling Ready to quit: No Counseling given: Not Answered   Clinical Intake:  Pre-visit preparation completed: Yes  Pain : No/denies pain     BMI - recorded: 18.5 Nutritional Status: BMI <19  Underweight Nutritional Risks: None Diabetes: No  How often do you need to have someone help you when you read instructions, pamphlets, or other written materials from your doctor or pharmacy?: 1 - Never  Interpreter Needed?: No      Activities of Daily Living    12/31/2023    5:27 AM 04/18/2023    1:46 PM  In your present state of health, do you have any difficulty performing the following activities:  Hearing? 0 0  Vision? 0 0  Difficulty concentrating or making decisions? 0 0  Walking or climbing stairs? 0 0  Dressing or  bathing? 0 0  Doing errands, shopping? 0   Preparing Food and eating ? N   Using the Toilet? N   In the past six months, have you accidently leaked urine? N   Do you have problems with loss of bowel control? N   Managing your Medications? N   Managing your Finances? N   Housekeeping or managing your Housekeeping? N     Patient Care Team: Sharma Coyer, MD as PCP - General (Family Medicine) Georgina Shasta POUR, RN as Oncology Nurse Navigator Melanee Annah BROCKS, MD as Consulting Physician (Oncology) Roney, Jenna, OD as Referring Physician (Optometry)  Indicate any recent Medical Services you may have received from other than [facility] providers in the past year (date may be approximate).      Assessment:   This is a routine wellness examination for [place].  Hearing/Vision screen No results found.   Goals Addressed   None   Depression Screen    01/01/2024    9:01 AM 04/30/2023   10:47 AM 12/28/2022    9:52 AM 12/26/2021   10:29 AM 12/24/2020   11:26 AM  PHQ 2/9 Scores  PHQ - 2 Score 1 3 1  0 1  PHQ- 9 Score 4  3 2 2     Fall Risk    12/31/2023    5:27 AM 12/28/2022    9:52 AM 12/26/2021   10:29 AM 01/26/2021    1:30 PM 12/24/2020   11:26 AM  Fall Risk   Falls in the past year? 0 0 0 0 0  Number falls in past yr: 0 0 0 0 0  Injury with Fall? 0 0 0 0 0  Risk for fall due to :  No Fall Risks No Fall Risks    Follow up  Falls evaluation completed  MEDICARE RISK AT HOME: Medicare Risk at Home Any stairs in or around the home?: (Patient-Rptd) Yes If so, are there any without handrails?: (Patient-Rptd) No Home free of loose throw rugs in walkways, pet beds, electrical cords, etc?: (Patient-Rptd) Yes Adequate lighting in your home to reduce risk of falls?: (Patient-Rptd) Yes Life alert?: (Patient-Rptd) No Use of a cane, walker or w/c?: (Patient-Rptd) No Grab bars in the bathroom?: (Patient-Rptd) Yes Shower chair or bench in shower?: (Patient-Rptd) No Elevated toilet seat or a  handicapped toilet?: (Patient-Rptd) No  TIMED UP AND GO:  Was the test performed?  No    Cognitive Function:        01/01/2024    8:58 AM 01/26/2021    1:33 PM  6CIT Screen  What Year? 4 points 0 points  What month? 3 points 0 points  What time? 3 points 0 points  Count back from 20 0 points 0 points  Months in reverse 0 points 0 points  Repeat phrase 0 points 0 points  Total Score 10 points 0 points    Immunizations Immunization History  Administered Date(s) Administered   Fluad Quad(high Dose 65+) 08/21/2019   Influenza, High Dose Seasonal PF 09/08/2018, 08/27/2020   Influenza-Unspecified 08/24/2017, 09/08/2018, 08/21/2019, 09/06/2021, 08/24/2022, 08/10/2023   PFIZER(Purple Top)SARS-COV-2 Vaccination 01/19/2020, 02/09/2020, 09/06/2020   PNEUMOCOCCAL CONJUGATE-20 01/01/2024   Pneumococcal Polysaccharide-23 09/12/2013   Tdap 01/27/2011    TDAP status: Due, Education has been provided regarding the importance of this vaccine. Advised may receive this vaccine at local pharmacy or Health Dept. Aware to provide a copy of the vaccination record if obtained from local pharmacy or Health Dept. Verbalized acceptance and understanding.  Flu Vaccine status: Up to date  Pneumococcal vaccine status: Due, Education has been provided regarding the importance of this vaccine. Advised may receive this vaccine at local pharmacy or Health Dept. Aware to provide a copy of the vaccination record if obtained from local pharmacy or Health Dept. Verbalized acceptance and understanding.  Covid-19 vaccine status: Declined, Education has been provided regarding the importance of this vaccine but patient still declined. Advised may receive this vaccine at local pharmacy or Health Dept.or vaccine clinic. Aware to provide a copy of the vaccination record if obtained from local pharmacy or Health Dept. Verbalized acceptance and understanding.  Qualifies for Shingles Vaccine? Yes   Zostavax completed No    Shingrix Completed?: No.    Education has been provided regarding the importance of this vaccine. Patient has been advised to call insurance company to determine out of pocket expense if they have not yet received this vaccine. Advised may also receive vaccine at local pharmacy or Health Dept. Verbalized acceptance and understanding.  Screening Tests Health Maintenance  Topic Date Due   Zoster Vaccines- Shingrix (1 of 2) Never done   DTaP/Tdap/Td (2 - Td or Tdap) 01/26/2021   COVID-19 Vaccine (4 - 2024-25 season) 07/29/2023   Colonoscopy  08/10/2023   Lung Cancer Screening  02/20/2024   MAMMOGRAM  03/12/2024   Medicare Annual Wellness (AWV)  12/31/2024   Pneumonia Vaccine 93+ Years old  Completed   INFLUENZA VACCINE  Completed   DEXA SCAN  Completed   Hepatitis C Screening  Completed   HPV VACCINES  Aged Out    Health Maintenance  Health Maintenance Due  Topic Date Due   Zoster Vaccines- Shingrix (1 of 2) Never done   DTaP/Tdap/Td (2 - Td or Tdap) 01/26/2021   COVID-19 Vaccine (4 - 2024-25 season) 07/29/2023  Colonoscopy  08/10/2023    Colorectal cancer screening: Referral to GI placed with [facility]. Pt aware the office will call re: appt.  Mammogram status: Ordered 02/15/24. Pt provided with contact info and advised to call to schedule appt.   Bone Density status: Completed 08/2023. Results reflect: Bone density results: OSTEOPOROSIS. Repeat every 2 years.  Lung Cancer Screening: (Low Dose CT Chest recommended if Age 67-80 years, 20 pack-year currently smoking OR have quit w/in 15years.) does qualify.   Lung Cancer Screening Referral: referral placed today   Additional Screening:  Hepatitis C Screening: does not qualify; Completed 2023, negative   Vision Screening: Recommended annual ophthalmology exams for early detection of glaucoma and other disorders of the eye. Is the patient up to date with their annual eye exam?  Yes  Who is the provider or what is the name of the  office in which the patient attends annual eye exams? Dr. Royetta   Dental Screening: Recommended annual dental exams for proper oral hygiene  Community Resource Referral / Chronic Care Management: CRR required this visit?  No   CCM required this visit?  No  Physical Exam Vitals reviewed.  Constitutional:      General: She is not in acute distress.    Appearance: Normal appearance. She is not ill-appearing, toxic-appearing or diaphoretic.  Eyes:     Conjunctiva/sclera: Conjunctivae normal.   Cardiovascular:     Rate and Rhythm: Normal rate and regular rhythm.     Pulses: Normal pulses.     Heart sounds: Normal heart sounds. No murmur heard.    No friction rub. No gallop.  Pulmonary:     Effort: Pulmonary effort is normal. No respiratory distress.     Breath sounds: Normal breath sounds. No stridor. No wheezing, rhonchi or rales.  Abdominal:     General: Bowel sounds are normal. There is no distension.     Palpations: Abdomen is soft.     Tenderness: There is no abdominal tenderness.  Musculoskeletal:     Right lower leg: No edema.     Left lower leg: No edema.  Skin:    Findings: No erythema or rash.  Neurological:     Mental Status: She is alert and oriented to person, place, and time.  Psychiatric:        Mood and Affect: Mood and affect normal.        Speech: Speech normal.        Behavior: Behavior normal. Behavior is cooperative.      Plan:     I have personally reviewed and noted the following in the patient's chart:   Medical and social history Use of alcohol, tobacco or illicit drugs  Current medications and supplements including opioid prescriptions. Patient is not currently taking opioid prescriptions. Functional ability and status Nutritional status Physical activity Advanced directives List of other physicians Hospitalizations, surgeries, and ER visits in previous 12 months Vitals Screenings to include cognitive, depression, and falls Referrals  and appointments  In addition, I have reviewed and discussed with patient certain preventive protocols, quality metrics, and best practice recommendations. A written personalized care plan for preventive services as well as general preventive health recommendations were provided to patient.     Rockie Agent, MD   01/01/2024

## 2024-01-02 ENCOUNTER — Encounter: Payer: Self-pay | Admitting: Family Medicine

## 2024-01-02 LAB — LIPID PANEL
Chol/HDL Ratio: 1.8 {ratio} (ref 0.0–4.4)
Cholesterol, Total: 160 mg/dL (ref 100–199)
HDL: 90 mg/dL (ref >39)
LDL Chol Calc (NIH): 56 mg/dL (ref 0–99)
Triglycerides: 70 mg/dL (ref 0–149)
VLDL Cholesterol Cal: 14 mg/dL (ref 5–40)

## 2024-01-02 LAB — CMP14+EGFR
ALT: 30 [IU]/L (ref 0–32)
AST: 32 [IU]/L (ref 0–40)
Albumin: 4.6 g/dL (ref 3.8–4.8)
Alkaline Phosphatase: 91 [IU]/L (ref 44–121)
BUN/Creatinine Ratio: 14 (ref 12–28)
BUN: 11 mg/dL (ref 8–27)
Bilirubin Total: 0.6 mg/dL (ref 0.0–1.2)
CO2: 25 mmol/L (ref 20–29)
Calcium: 10 mg/dL (ref 8.7–10.3)
Chloride: 98 mmol/L (ref 96–106)
Creatinine, Ser: 0.81 mg/dL (ref 0.57–1.00)
Globulin, Total: 2.5 g/dL (ref 1.5–4.5)
Glucose: 85 mg/dL (ref 70–99)
Potassium: 4.1 mmol/L (ref 3.5–5.2)
Sodium: 137 mmol/L (ref 134–144)
Total Protein: 7.1 g/dL (ref 6.0–8.5)
eGFR: 76 mL/min/{1.73_m2} (ref >59)

## 2024-01-02 LAB — CBC
Hematocrit: 41.9 % (ref 34.0–46.6)
Hemoglobin: 13.7 g/dL (ref 11.1–15.9)
MCH: 30.1 pg (ref 26.6–33.0)
MCHC: 32.7 g/dL (ref 31.5–35.7)
MCV: 92 fL (ref 79–97)
Platelets: 141 10*3/uL — ABNORMAL LOW (ref 150–450)
RBC: 4.55 x10E6/uL (ref 3.77–5.28)
RDW: 13.1 % (ref 11.7–15.4)
WBC: 4.9 10*3/uL (ref 3.4–10.8)

## 2024-01-02 LAB — TSH+T4F+T3FREE
Free T4: 1.07 ng/dL (ref 0.82–1.77)
T3, Free: 3.1 pg/mL (ref 2.0–4.4)
TSH: 1.2 u[IU]/mL (ref 0.450–4.500)

## 2024-01-02 LAB — HEMOGLOBIN A1C
Est. average glucose Bld gHb Est-mCnc: 100 mg/dL
Hgb A1c MFr Bld: 5.1 % (ref 4.8–5.6)

## 2024-01-02 LAB — VITAMIN D 25 HYDROXY (VIT D DEFICIENCY, FRACTURES): Vit D, 25-Hydroxy: 54.5 ng/mL (ref 30.0–100.0)

## 2024-01-04 ENCOUNTER — Encounter: Payer: Self-pay | Admitting: Family Medicine

## 2024-01-06 ENCOUNTER — Encounter: Payer: Self-pay | Admitting: Family Medicine

## 2024-01-10 ENCOUNTER — Encounter: Payer: Self-pay | Admitting: Oncology

## 2024-01-11 ENCOUNTER — Encounter: Payer: Self-pay | Admitting: Family Medicine

## 2024-01-12 ENCOUNTER — Encounter: Payer: Self-pay | Admitting: Family Medicine

## 2024-01-16 ENCOUNTER — Ambulatory Visit: Payer: Medicare PPO | Admitting: Radiation Oncology

## 2024-01-28 ENCOUNTER — Ambulatory Visit: Payer: Medicare PPO | Admitting: Radiation Oncology

## 2024-02-01 ENCOUNTER — Other Ambulatory Visit: Payer: Self-pay | Admitting: Oncology

## 2024-02-15 ENCOUNTER — Other Ambulatory Visit: Payer: Self-pay | Admitting: Oncology

## 2024-02-15 ENCOUNTER — Ambulatory Visit
Admission: RE | Admit: 2024-02-15 | Discharge: 2024-02-15 | Disposition: A | Discharge status: Home / Self Care | Payer: Medicare PPO | Source: Ambulatory Visit | Attending: Oncology | Admitting: Oncology

## 2024-02-15 DIAGNOSIS — Z79899 Other long term (current) drug therapy: Secondary | ICD-10-CM | POA: Diagnosis present

## 2024-02-15 DIAGNOSIS — D0511 Intraductal carcinoma in situ of right breast: Secondary | ICD-10-CM | POA: Diagnosis present

## 2024-02-15 DIAGNOSIS — R921 Mammographic calcification found on diagnostic imaging of breast: Secondary | ICD-10-CM

## 2024-02-18 ENCOUNTER — Encounter: Payer: Self-pay | Admitting: Radiation Oncology

## 2024-02-18 ENCOUNTER — Ambulatory Visit
Admission: RE | Admit: 2024-02-18 | Discharge: 2024-02-18 | Disposition: A | Discharge status: Home / Self Care | Payer: Medicare PPO | Source: Ambulatory Visit | Attending: Radiation Oncology | Admitting: Radiation Oncology

## 2024-02-18 VITALS — BP 133/89 | HR 77 | Resp 20 | Wt 112.5 lb

## 2024-02-18 DIAGNOSIS — D0511 Intraductal carcinoma in situ of right breast: Secondary | ICD-10-CM | POA: Insufficient documentation

## 2024-02-18 DIAGNOSIS — Z170 Estrogen receptor positive status [ER+]: Secondary | ICD-10-CM | POA: Insufficient documentation

## 2024-02-18 DIAGNOSIS — Z923 Personal history of irradiation: Secondary | ICD-10-CM | POA: Diagnosis not present

## 2024-02-18 DIAGNOSIS — Z79811 Long term (current) use of aromatase inhibitors: Secondary | ICD-10-CM | POA: Insufficient documentation

## 2024-02-18 NOTE — Progress Notes (Signed)
 Radiation Oncology Follow up Note  Name: Sandra Franco   Date:   02/18/2024 MRN:  [id number] DOB: July 01, 1951    This 73 y.o. 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  REFERRING PROVIDER: Alfredia Ferguson, PA-C  HPI: Patient is a 73 year old female now out 7 months having pleated whole breast radiation to her right breast for ER positive ductal carcinoma in situ.  Seen today in routine follow-up she is doing well.  She specifically denies breast tenderness cough or bone pain..  She is currently on Arimidex tolerating it well without side effect.  She had recent mammograms this month showing new lumpectomy changes in the right breast.  And recommendation for continued close surveillance based on some residual calcifications were recommended.  Left breast was benign.  She will have follow-up mammograms in 6 months.  COMPLICATIONS OF TREATMENT: none  FOLLOW UP COMPLIANCE: keeps appointments   PHYSICAL EXAM:  BP 133/89   Pulse 77   Resp 20   Wt 112 lb 8 oz (51 kg)   SpO2 100%   BMI 18.72 kg/m  Lungs are clear to A&P cardiac examination essentially unremarkable with regular rate and rhythm. No dominant mass or nodularity is noted in either breast in 2 positions examined. Incision is well-healed. No axillary or supraclavicular adenopathy is appreciated. Cosmetic result is excellent.  RADIOLOGY RESULTS: Mammograms reviewed compatible with above-stated findings  PLAN: Present time patient is doing well.  She continues close close follow-up care on a biyearly yearly basis with Dr. Smith Robert.  I have asked to see her back in 1 year for follow-up.  Patient continues on Arimidex without side effect.  Patient knows to call with any concerns.  I would like to take this opportunity to thank you for allowing me to participate in the care of your patient.Carmina Miller, MD

## 2024-02-21 ENCOUNTER — Ambulatory Visit
Admission: RE | Admit: 2024-02-21 | Discharge: 2024-02-21 | Disposition: A | Discharge status: Home / Self Care | Payer: Medicare PPO | Source: Ambulatory Visit | Attending: Acute Care | Admitting: Acute Care

## 2024-02-21 DIAGNOSIS — Z87891 Personal history of nicotine dependence: Secondary | ICD-10-CM | POA: Insufficient documentation

## 2024-02-21 DIAGNOSIS — Z122 Encounter for screening for malignant neoplasm of respiratory organs: Secondary | ICD-10-CM | POA: Insufficient documentation

## 2024-02-21 DIAGNOSIS — F17210 Nicotine dependence, cigarettes, uncomplicated: Secondary | ICD-10-CM | POA: Diagnosis present

## 2024-02-22 NOTE — Progress Notes (Signed)
 [facility] POST-OP OFFICE VISIT  02/26/2024  HPI: Sandra Franco is a 73 y.o. female  s/p RFID tag to right breast lumpectomy for DCIS, DOS Apr 18, 2023. Reports the previously noted occasional stinging pain has resolved.  She denies any other significant pain or tenderness.  No fevers, no chills, no ecchymosis.  No erythema.  Reviewed the recent imaging.    Vital signs: BP (!) 149/82   Pulse 73   Ht 5\' 5"  (1.651 m)   Wt 110 lb 9.6 oz (50.2 kg)   SpO2 97%   BMI 18.40 kg/m    Physical Exam: Constitutional: She appears well  Skin: Incision of the right breast has healed well.  There is no evidence of seroma.   Remaining breast exam is unremarkable for anticipated right breast radiation changes, slight asymmetry due to the same.  No suspicious nodularity nor mass in the left breast.  Supraclavicular and axillary areas are free of lymphadenopathy.  SURGICAL PATHOLOGY * THIS IS AN ADDENDUM REPORT * CASE: [id number] PATIENT: [name] Surgical Pathology Report *Addendum *  Reason for Addendum #1:  Breast Biomarker Results  Specimen Submitted: A. Breast, right, UOQ B. Breast, right, UOQ, lateral superior  Clinical History: DCIS  DIAGNOSIS: A.  BREAST, RIGHT UPPER OUTER QUADRANT; LUMPECTOMY: - DUCTAL CARCINOMA IN SITU (DCIS). - SEE CANCER SUMMARY BELOW. - BIOPSY SITE CHANGE AND ASSOCIATED CLIP. - RFID TAG IN PLACE. - APOCRINE METAPLASIA, COLUMNAR CELL CHANGE, FLAT EPITHELIAL ATYPIA, SCLEROSING ADENOSIS, AND USUAL DUCTAL HYPERPLASIA.  B.  BREAST, RIGHT UPPER OUTER QUADRANT LATERAL SUPERIOR; EXCISION: - COLUMNAR CELL CHANGE. - NEGATIVE FOR ATYPIA AND MALIGNANCY.  CANCER CASE SUMMARY: DUCTAL CARCINOMA IN SITU OF THE BREAST Standard(s): AJCC-UICC 8  SPECIMEN Procedure: Lumpectomy Specimen Laterality: Right  TUMOR Histologic Type: Ductal carcinoma in situ Size (Extent) of DCIS:  at least 15 mm Nuclear Grade: Grade 2 (intermediate) Necrosis: Not  identified  MARGINS Margin Status: All margins negative for DCIS      Distance from DCIS to closest margin: 3 mm      Specify closest margin: Anterior  REGIONAL LYMPH NODES Regional Lymph node Status: Not applicable (no regional lymph nodes submitted or found)  DISTANT METASTASIS Distant Site(s) Involved, if applicable (select all that apply): Not applicable  PATHOLOGIC STAGE CLASSIFICATION (pTNM, AJCC 8th Edition) TNM Descriptors: Not applicable pTis (DCIS) Regional Lymph Nodes Modifier: Not applicable pN - Not applicable (no regional lymph nodes submitted or found) pM - Not applicable  SPECIAL STUDIES Breast Biomarker Testing will be performed and reported in an addendum.  Comment: Residual DCIS is present in the lumpectomy specimen (part A) lateral and superior to the biopsy site. While DCIS focally involves the lateral margin and is close to the superior margin in part A the re-excised lateral superior tissue in part B is negative. The final margins are negative. The above AJCC cancer summary incorporates the findings from both specimens.  GROSS DESCRIPTION: Intraoperative Consultation:     Labeled: Right breast upper outer quadrant     Received: Fresh     Specimen: Breast lumpectomy     Pathologic evaluation performed: Gross margin evaluation     Diagnosis: IOC: Biopsy site; no obvious mass     Communicated to: Called to Dr. Claudine Mouton at 2:50 PM on 04/18/2023   CLINICAL DATA:  History RIGHT lumpectomy in May 2024 which demonstrated DCIS. Status post radiation. Negative margins. On anastrozole. Patient had multiple additional benign biopsies of the RIGHT breast of calcifications prior to lumpectomy  which demonstrated benign results (COIL clip and X clip). Pre-surgical MRI was also reassuring.   EXAM: DIGITAL DIAGNOSTIC BILATERAL MAMMOGRAM WITH TOMOSYNTHESIS AND CAD   TECHNIQUE: Bilateral digital diagnostic mammography and breast tomosynthesis was performed.  The images were evaluated with computer-aided detection.   COMPARISON:  Previous exam(s).   ACR Breast Density Category c: The breasts are heterogeneously dense, which may obscure small masses.   FINDINGS: There is density and architectural distortion within the RIGHT breast, consistent with postsurgical changes. These are new in comparison to prior. Spot magnification views of the RIGHT breast demonstrate multiple loose groups of punctate calcifications. These are favored similar dating back to more remote priors but are challenging to compare given differences in technique. Representative loose group of calcifications in the inner breast at anterior depth spans 3 mm.   No suspicious mass, distortion, or microcalcifications are identified to suggest presence of malignancy in the LEFT breast.   IMPRESSION: 1. Expected new lumpectomy changes in the RIGHT breast. There are scattered and loosely grouped punctate calcifications in the RIGHT breast. Two additional areas of calcifications were sampled in April 2024 with benign results. Given history of ipsilateral ductal carcinoma in-situ which presented with calcifications, recommend close follow-up of these loose groups of calcifications to ensure mammographic stability with similar spot magnification technique. 2. No mammographic evidence of malignancy in the LEFT breast.   RECOMMENDATION: RIGHT diagnostic mammogram (with RIGHT breast ultrasound if deemed necessary) in 6 months   I have discussed the findings and recommendations with the patient. If applicable, a reminder letter will be sent to the patient regarding the next appointment.   BI-RADS CATEGORY  3: Probably benign.     Electronically Signed   By: Meda Klinefelter M.D.   On: 02/15/2024 11:26  Assessment/Plan: Sandra Franco is a 73 y.o. female  s/p RFID tag to right breast lumpectomy for DCIS, DOS Apr 18, 2023.  Patient Active Problem List   Diagnosis Date  Noted   Abnormal mammogram of right breast 03/15/2023   Ductal carcinoma in situ (DCIS) of right breast 03/15/2023   COVID-19 11/23/2022   Elevated blood-pressure reading without diagnosis of hypertension 12/26/2021   Screening mammogram, encounter for 12/26/2020   Thrombocytopenia (HCC) 01/20/2019   Tobacco use disorder 10/04/2018   Heavy alcohol consumption 04/23/2018   Depression 04/23/2018   Osteopenia     -Made sure she understood that we we will be planning on repeat diagnostic imaging of the right breast in 6 months.  No need to see her back for clinical examination at that time.  However, I will be happy to see her as needed, but will anticipate an annual exam, assuming the imaging does not lead to additional biopsy.   Campbell Lerner M.D., FACS 02/26/2024, 12:07 PM

## 2024-02-26 ENCOUNTER — Encounter: Payer: Self-pay | Admitting: Surgery

## 2024-02-26 ENCOUNTER — Ambulatory Visit: Payer: Medicare PPO | Admitting: Surgery

## 2024-02-26 VITALS — BP 149/82 | HR 73 | Ht 65.0 in | Wt 110.6 lb

## 2024-02-26 DIAGNOSIS — Z09 Encounter for follow-up examination after completed treatment for conditions other than malignant neoplasm: Secondary | ICD-10-CM

## 2024-02-26 DIAGNOSIS — R928 Other abnormal and inconclusive findings on diagnostic imaging of breast: Secondary | ICD-10-CM

## 2024-02-26 DIAGNOSIS — D0511 Intraductal carcinoma in situ of right breast: Secondary | ICD-10-CM

## 2024-02-26 NOTE — Patient Instructions (Addendum)
 The patient has been asked to return to the office in six months with a unilateral right breast diagnostic mammogram. We will send you a letter about this appointments   Continue self breast exams. Call office for any new breast issues or concerns.    Breast Self-Awareness Breast self-awareness is knowing how your breasts look and feel. You need to: Check your breasts on a regular basis. Tell your doctor about any changes. Become familiar with the look and feel of your breasts. This can help you catch a breast problem while it is still small and can be treated. You should do breast self-exams even if you have breast implants. What you need: A mirror. A well-lit room. A pillow or other soft object. How to do a breast self-exam Follow these steps to do a breast self-exam: Look for changes  Take off all the clothes above your waist. Stand in front of a mirror in a room with good lighting. Put your hands down at your sides. Compare your breasts in the mirror. Look for any difference between them, such as: A difference in shape. A difference in size. Wrinkles, dips, and bumps in one breast and not the other. Look at each breast for changes in the skin, such as: Redness. Scaly areas. Skin that has gotten thicker. Dimpling. Open sores (ulcers). Look for changes in your nipples, such as: Fluid coming out of a nipple. Fluid around a nipple. Bleeding. Dimpling. Redness. A nipple that looks pushed in (retracted), or that has changed position. Feel for changes Lie on your back. Feel each breast. To do this: Pick a breast to feel. Place a pillow under the shoulder closest to that breast. Put the arm closest to that breast behind your head. Feel the nipple area of that breast using the hand of your other arm. Feel the area with the pads of your three middle fingers by making small circles with your fingers. Use light, medium, and firm pressure. Continue the overlapping circles, moving  downward over the breast. Keep making circles with your fingers. Stop when you feel your ribs. Start making circles with your fingers again, this time going upward until you reach your collarbone. Then, make circles outward across your breast and into your armpit area. Squeeze your nipple. Check for discharge and lumps. Repeat these steps to check your other breast. Sit or stand in the tub or shower. With soapy water on your skin, feel each breast the same way you did when you were lying down. Write down what you find Writing down what you find can help you remember what to tell your doctor. Write down: What is normal for each breast. Any changes you find in each breast. These include: The kind of changes you find. A tender or painful breast. Any lump you find. Write down its size and where it is. When you last had your monthly period (menstrual cycle). General tips If you are breastfeeding, the best time to check your breasts is after you feed your baby or after you use a breast pump. If you get monthly bleeding, the best time to check your breasts is 5-7 days after your monthly cycle ends. With time, you will become comfortable with the self-exam. You will also start to know if there are changes in your breasts. Contact a doctor if: You see a change in the shape or size of your breasts or nipples. You see a change in the skin of your breast or nipples, such as red or  scaly skin. You have fluid coming from your nipples that is not normal. You find a new lump or thick area. You have breast pain. You have any concerns about your breast health. Summary Breast self-awareness includes looking for changes in your breasts and feeling for changes within your breasts. You should do breast self-awareness in front of a mirror in a well-lit room. If you get monthly periods (menstrual cycles), the best time to check your breasts is 5-7 days after your period ends. Tell your doctor about any changes  you see in your breasts. Changes include changes in size, changes on the skin, painful or tender breasts, or fluid from your nipples that is not normal. This information is not intended to replace advice given to you by your health care provider. Make sure you discuss any questions you have with your health care provider. Document Revised: 04/20/2022 Document Reviewed: 09/15/2021 Elsevier Patient Education  2024 [employer].

## 2024-02-27 ENCOUNTER — Other Ambulatory Visit: Payer: Self-pay | Admitting: Physician Assistant

## 2024-02-27 ENCOUNTER — Encounter: Payer: Self-pay | Admitting: Surgery

## 2024-02-27 ENCOUNTER — Encounter: Payer: Self-pay | Admitting: Family Medicine

## 2024-02-27 DIAGNOSIS — E7800 Pure hypercholesterolemia, unspecified: Secondary | ICD-10-CM

## 2024-02-27 MED ORDER — ROSUVASTATIN CALCIUM 5 MG PO TABS: 5.0000 mg | ORAL_TABLET | Freq: Every day | ORAL | 3 refills | Status: DC

## 2024-03-27 ENCOUNTER — Telehealth: Payer: Self-pay | Admitting: Acute Care

## 2024-03-27 NOTE — Telephone Encounter (Signed)
 Received call report:  IMPRESSION: 1. New patchy peribronchovascular ground-glass in the right upper and right middle lobes, likely infectious/inflammatory in etiology. Lung-RADS 0, incomplete. Additional lung cancer screening CT images/or comparison to prior chest CT examinations is needed. Follow-up low-dose lung cancer screening CT in 4-6 weeks is recommended after appropriate clinical therapy, as malignancy cannot be excluded. These results will be called to the ordering clinician or representative by the Radiologist Assistant, and communication documented in the PACS or [employer]. 2.  Aortic atherosclerosis (ICD10-I70.0). 3.  Emphysema (ICD10-J43.9).  Recent hx breast cancer

## 2024-03-31 ENCOUNTER — Telehealth: Payer: Self-pay | Admitting: Acute Care

## 2024-03-31 ENCOUNTER — Encounter: Payer: Self-pay | Admitting: Family Medicine

## 2024-03-31 NOTE — Telephone Encounter (Signed)
 I have attempted to call the patient with the results of her low-dose screening CT.  There was no answer.  I have left a HIPAA compliant message requesting that the patient give us  a call to go over the results of her scan with our contact information.  Arlean Bellow, Debbi Failing, and [place] patient scan was read as lung RADS 0 if patient calls back please ask if she had been sick, specifically around March 27 when she had the scan done.  Please document if she has been sick but regardless she needs a follow-up low-dose CT chest which is actually recommended for 4 to 6 weeks we are currently at 5 weeks based on the imaging date versus the scan read date.  Please continue to try and get in touch with the patient so that we can get her scheduled for her follow-up scan.  Please fax results to PCP let them know plan is for a 4 to 6-week follow-up scan to reevaluate the area of patchy peribronchovascular groundglass in the right upper and middle lobes.  Most likely infectious or inflammatory in etiology per radiology, however needs a short-term follow-up which will be due mid May 2025.  Thank so much everyone

## 2024-03-31 NOTE — Telephone Encounter (Signed)
 Please see the message below.

## 2024-04-01 ENCOUNTER — Other Ambulatory Visit: Payer: Self-pay | Admitting: Physician Assistant

## 2024-04-01 DIAGNOSIS — F32A Depression, unspecified: Secondary | ICD-10-CM

## 2024-04-01 NOTE — Telephone Encounter (Signed)
 Hi Sandra Franco,  I spoke with [name]. Patient advises her allergies have been terrible the last 4-6 weeks and she did developed a cold around the time of her scan. She states she is much better now. Pt sees her PCP tomorrow for f/u. I advised the patient I would call her back tomorrow after her PCP appt to discuss if any treatment was started. F/U scan will be scheduled at that time.   [name]

## 2024-04-02 ENCOUNTER — Encounter: Payer: Self-pay | Admitting: Family Medicine

## 2024-04-02 ENCOUNTER — Ambulatory Visit (INDEPENDENT_AMBULATORY_CARE_PROVIDER_SITE_OTHER): Admitting: Family Medicine

## 2024-04-02 VITALS — BP 150/76 | HR 87 | Ht 65.0 in | Wt 111.0 lb

## 2024-04-02 DIAGNOSIS — J432 Centrilobular emphysema: Secondary | ICD-10-CM | POA: Diagnosis not present

## 2024-04-02 DIAGNOSIS — J3089 Other allergic rhinitis: Secondary | ICD-10-CM | POA: Insufficient documentation

## 2024-04-02 DIAGNOSIS — I10 Essential (primary) hypertension: Secondary | ICD-10-CM

## 2024-04-02 DIAGNOSIS — I700 Atherosclerosis of aorta: Secondary | ICD-10-CM | POA: Insufficient documentation

## 2024-04-02 DIAGNOSIS — J189 Pneumonia, unspecified organism: Secondary | ICD-10-CM

## 2024-04-02 DIAGNOSIS — F17200 Nicotine dependence, unspecified, uncomplicated: Secondary | ICD-10-CM | POA: Diagnosis not present

## 2024-04-02 MED ORDER — ROSUVASTATIN CALCIUM 20 MG PO TABS
20.0000 mg | ORAL_TABLET | Freq: Every day | ORAL | 3 refills | Status: AC
Start: 2024-04-02 — End: ?

## 2024-04-02 MED ORDER — AZITHROMYCIN 250 MG PO TABS: ORAL_TABLET | ORAL | 0 refills | Status: AC

## 2024-04-02 MED ORDER — CEFPODOXIME PROXETIL 200 MG PO TABS: 200.0000 mg | ORAL_TABLET | Freq: Two times a day (BID) | ORAL | 0 refills | Status: AC

## 2024-04-02 MED ORDER — TRELEGY ELLIPTA 100-62.5-25 MCG/ACT IN AEPB: 1.0000 | INHALATION_SPRAY | Freq: Every day | RESPIRATORY_TRACT | Status: DC

## 2024-04-02 MED ORDER — LORATADINE 10 MG PO TABS: 10.0000 mg | ORAL_TABLET | Freq: Every day | ORAL | 12 refills | Status: DC

## 2024-04-02 NOTE — Assessment & Plan Note (Signed)
 -  Advised smoking cessation

## 2024-04-02 NOTE — Progress Notes (Signed)
 ACUTE PATIENT VISIT    Patient: Sandra Franco   DOB: 1951/11/18   73 y.o. Female  MRN: [id number] Visit Date: 04/02/2024  Today's healthcare provider: Mimi Alt, MD   PCP: Mimi Alt, MD   Chief Complaint  Patient presents with   Allergies    Discuss treatment, and CT results     Subjective     HPI     Allergies    Additional comments: Discuss treatment, and CT results       Last edited by Bart Lieu, CMA on 04/02/2024  8:12 AM.       Discussed the use of AI scribe software for clinical note transcription with the patient, who gave verbal consent to proceed.  History of Present Illness          Discussed the use of AI scribe software for clinical note transcription with the patient, who gave verbal consent to proceed.  History of Present Illness Sandra Franco is a 73 year old female who presents with concern for allergy symptoms and to discuss recent CT chest results.  She has been experiencing severe allergy symptoms for weeks, including itchy, watery eyes, itchy ears, itchy nose, and rhinorrhea. These symptoms have progressed to include a productive cough, which she describes as 'pretty bad' and has persisted for weeks. She has been using Mucinex, which has helped keep the mucus loose, and she is able to expectorate clear, thick sputum. She is currently not on any regular allergy medications like Allegra, Claritin, or Zyrtec, and has not had success with nasal sprays due to the gag reflex they induce.  She recently underwent a CT chest scan on February 21, 2024, as part of a lung cancer screening. The results, received on March 26, 2024, showed new patchy peribronchovascular ground glass opacities in the right upper and middle lobes. The scan also noted aortic atherosclerosis, emphysema, and osteopenia, along with degenerative changes in the spine and central lobar emphysema. There were no enlarged lymph nodes or pericardial  effusion.  She has a history of elevated blood pressure, with today's reading at 163/81. She reports experiencing 'white coat syndrome' and notes that her blood pressure at home was last recorded at 138/88. She is currently on Crestor  5 mg for cholesterol management, which she reports has normalized her LDL and total cholesterol levels.  She has a history of allergies to amoxicillin and experiences nausea with certain antibiotics. She has tried Nasonex nasal spray in the past but found it intolerable due to a salty taste that caused gagging. She has also tried Sudafed for sinus issues but did not find it effective for her allergies. No fever or feeling unwell.       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   Cataract    COPD (chronic obstructive pulmonary disease) (HCC)    COVID-19    Depression    Emphysema of lung (HCC) 2022   Heavy alcohol consumption    Hypertension 2022 "white coat syndrome"   Mood disorder (HCC)    Osteopenia    Thrombocytopenia (HCC)    Tobacco use disorder    Tubular adenoma of colon 2019   needs another colonoscopy in 3 years    Medications: Outpatient Medications Prior to Visit  Medication Sig   anastrozole  (ARIMIDEX ) 1 MG tablet TAKE 1 TABLET(1  MG) BY MOUTH DAILY   buPROPion  (WELLBUTRIN  XL) 300 MG 24 hr tablet TAKE 1 TABLET(300 MG) BY MOUTH DAILY   Calcium  Citrate-Vitamin D  (CALCIUM  + D PO) Take 1 capsule by mouth daily.   ibuprofen (ADVIL) 200 MG tablet Take 200 mg by mouth every 6 (six) hours as needed.   Multiple Vitamin (MULTIVITAMIN) capsule Take 1 capsule by mouth daily.   [DISCONTINUED] rosuvastatin  (CRESTOR ) 5 MG tablet Take 1 tablet (5 mg total) by mouth daily.   No facility-administered medications prior to visit.    Review of Systems  Last CBC Lab Results  Component Value Date   WBC 4.9 01/01/2024    HGB 13.7 01/01/2024   HCT 41.9 01/01/2024   MCV 92 01/01/2024   MCH 30.1 01/01/2024   RDW 13.1 01/01/2024   PLT 141 (L) 01/01/2024   Last metabolic panel Lab Results  Component Value Date   GLUCOSE 85 01/01/2024   NA 137 01/01/2024   K 4.1 01/01/2024   CL 98 01/01/2024   CO2 25 01/01/2024   BUN 11 01/01/2024   CREATININE 0.81 01/01/2024   EGFR 76 01/01/2024   CALCIUM  10.0 01/01/2024   PROT 7.1 01/01/2024   ALBUMIN 4.6 01/01/2024   LABGLOB 2.5 01/01/2024   AGRATIO 1.8 12/26/2021   BILITOT 0.6 01/01/2024   ALKPHOS 91 01/01/2024   AST 32 01/01/2024   ALT 30 01/01/2024   ANIONGAP 8 07/31/2023   Last hemoglobin A1c Lab Results  Component Value Date   HGBA1C 5.1 01/01/2024   Last thyroid  functions Lab Results  Component Value Date   TSH 1.200 01/01/2024        Objective    BP (!) 150/76 (Cuff Size: Normal)   Pulse 87   Ht 5\' 5"  (1.651 m)   Wt 111 lb (50.3 kg)   SpO2 100%   BMI 18.47 kg/m   BP Readings from Last 3 Encounters:  04/02/24 (!) 150/76  02/26/24 (!) 149/82  02/18/24 133/89   Wt Readings from Last 3 Encounters:  04/02/24 111 lb (50.3 kg)  02/26/24 110 lb 9.6 oz (50.2 kg)  02/21/24 113 lb (51.3 kg)        Physical Exam HENT:     Nose:     Right Turbinates: Enlarged.     Mouth/Throat:     Dentition: Normal dentition.     Pharynx: Oropharynx is clear. No oropharyngeal exudate or posterior oropharyngeal erythema.  Cardiovascular:     Rate and Rhythm: Normal rate and regular rhythm.     Heart sounds: No murmur heard. Pulmonary:     Effort: Pulmonary effort is normal. No respiratory distress.     Breath sounds: Normal breath sounds. No decreased breath sounds, wheezing, rhonchi or rales.       No results found for any visits on 04/02/24.  Assessment & Plan     Problem List Items Addressed This Visit       Cardiovascular and Mediastinum   HTN (hypertension)   Hypertension Chronic Blood pressure elevated at 163/81 in the  office, but home readings are lower at approximately 138/88, suggesting possible white coat syndrome. Atherosclerotic changes noted on CT scan. - Instruct to monitor blood pressure at home daily or every other day and we will follow up home readings to determine recommendations for BP meds  - Increase Crestor  to 20 mg daily to address atherosclerotic changes.      Relevant Medications   rosuvastatin  (CRESTOR ) 20 MG tablet   Atherosclerosis of aorta (HCC)  Atherosclerotic calcification of the aorta on CT scan. Risk of cardiovascular events due to plaque buildup. Current Crestor  dose may be insufficient to manage plaque buildup. - Increase Crestor  to 20 mg daily to reduce cardiovascular risk.      Relevant Medications   rosuvastatin  (CRESTOR ) 20 MG tablet     Respiratory   Centrilobular emphysema (HCC)   Centrilobular emphysema on CT scan, likely smoking-related. No current respiratory distress, but presence of chronic cough. Trelegy inhaler chosen to manage emphysema and reduce inflammation. - Provide samples of Trelegy 100-62.5-25 mcg  inhaler, one puff daily.      Relevant Medications   loratadine (CLARITIN) 10 MG tablet   azithromycin (ZITHROMAX) 250 MG tablet   Fluticasone-Umeclidin-Vilant (TRELEGY ELLIPTA) 100-62.5-25 MCG/ACT AEPB     Other   Tobacco use disorder   Advised smoking cessation       Environmental and seasonal allergies - Primary   Relevant Medications   loratadine (CLARITIN) 10 MG tablet   Other Visit Diagnoses       Community acquired pneumonia of right middle lobe of lung       Relevant Medications   loratadine (CLARITIN) 10 MG tablet   azithromycin (ZITHROMAX) 250 MG tablet   cefpodoxime (VANTIN) 200 MG tablet   Fluticasone-Umeclidin-Vilant (TRELEGY ELLIPTA) 100-62.5-25 MCG/ACT AEPB         Assessment & Plan Infectious or inflammatory lung condition New patchy peribronchovascular ground glass opacities in the right upper and middle lobes on CT  scan. Differential includes infection or inflammation. Productive cough with clear, thick sputum. No fever or systemic symptoms. Previous radiation to the right lung. Concerns about Levaquin due to potential tendon issues; azithromycin and cefpodoxime chosen as alternatives. - Prescribe azithromycin (Z-Pak) for 5 days. - Prescribe cefpodoxime for 5 days. - Order follow-up chest CT in 4-6 weeks to reassess lung findings.          Return in about 3 months (around 07/03/2024) for CHRONIC F/U.         Mimi Alt, MD  [facility] [phone] (phone) [phone] (fax)  [facility]

## 2024-04-02 NOTE — Patient Instructions (Signed)
  VISIT SUMMARY: During today's visit, we discussed your severe allergy symptoms and reviewed the results of your recent CT chest scan. You have been experiencing itchy, watery eyes, itchy ears, itchy nose, and a productive cough. The CT scan showed new findings in your lungs, as well as aortic atherosclerosis, emphysema, and osteopenia. We also addressed your elevated blood pressure and cholesterol management.  YOUR PLAN: -INFECTIOUS OR INFLAMMATORY LUNG CONDITION: Your CT scan showed new areas of concern in your lungs, which could be due to an infection or inflammation. To treat this, you will take azithromycin (Z-Pak) for 5 days and cefpodoxime for 5 days. We will do another chest CT scan in 4-6 weeks to see if there are any changes.  -EMPHYSEMA: Emphysema is a lung condition often caused by smoking that makes it hard to breathe. To help manage this, you will use a Trelegy inhaler once daily. This should help reduce inflammation and improve your breathing.  -HYPERTENSION: Your blood pressure was high in the office, but lower at home, which may be due to 'white coat syndrome.' You should monitor your blood pressure at home daily or every other day. We are also increasing your Crestor  dose to 20 mg daily to help manage your cholesterol and reduce atherosclerosis.  -AORTIC ATHEROSCLEROSIS: Aortic atherosclerosis is the buildup of plaque in the aorta, which can increase the risk of heart problems. To help reduce this risk, we are increasing your Crestor  dose to 20 mg daily.  -ALLERGIC RHINITIS: Allergic rhinitis is an allergic reaction that causes itchy, watery eyes, itchy ears, and a runny nose. Since nasal sprays were not effective for you, we are prescribing Claritin to help manage your allergy symptoms.  INSTRUCTIONS: Please take azithromycin (Z-Pak) and cefpodoxime as prescribed for 5 days each. Use the Trelegy inhaler once daily. Monitor your blood pressure at home daily or every other day.  Increase your Crestor  dose to 20 mg daily. Take Claritin as prescribed for your allergies. We will schedule a follow-up chest CT scan in 4-6 weeks to reassess your lung condition.                      Contains text generated by [employer].                                 Contains text generated by [employer].

## 2024-04-02 NOTE — Assessment & Plan Note (Signed)
 Centrilobular emphysema on CT scan, likely smoking-related. No current respiratory distress, but presence of chronic cough. Trelegy inhaler chosen to manage emphysema and reduce inflammation. - Provide samples of Trelegy 100-62.5-25 mcg  inhaler, one puff daily.

## 2024-04-02 NOTE — Assessment & Plan Note (Signed)
 Atherosclerotic calcification of the aorta on CT scan. Risk of cardiovascular events due to plaque buildup. Current Crestor  dose may be insufficient to manage plaque buildup. - Increase Crestor  to 20 mg daily to reduce cardiovascular risk.

## 2024-04-02 NOTE — Assessment & Plan Note (Signed)
 Hypertension Chronic Blood pressure elevated at 163/81 in the office, but home readings are lower at approximately 138/88, suggesting possible white coat syndrome. Atherosclerotic changes noted on CT scan. - Instruct to monitor blood pressure at home daily or every other day and we will follow up home readings to determine recommendations for BP meds  - Increase Crestor  to 20 mg daily to address atherosclerotic changes.

## 2024-04-02 NOTE — Assessment & Plan Note (Signed)
 Chronic  Severe allergic symptoms including itchy, watery eyes, itchy ears, itchy and runny nose. Previous trial of Nasonex nasal spray was not tolerated due to gagging and salty taste. Sudafed was ineffective and not recommended due to potential impact on blood pressure. Claritin chosen as an alternative for allergy management. - Prescribe Claritin for allergy management.

## 2024-04-03 ENCOUNTER — Other Ambulatory Visit: Payer: Self-pay

## 2024-04-03 DIAGNOSIS — R911 Solitary pulmonary nodule: Secondary | ICD-10-CM

## 2024-04-03 DIAGNOSIS — Z87891 Personal history of nicotine dependence: Secondary | ICD-10-CM

## 2024-04-03 DIAGNOSIS — Z122 Encounter for screening for malignant neoplasm of respiratory organs: Secondary | ICD-10-CM

## 2024-04-03 NOTE — Telephone Encounter (Signed)
 I have called and spoke with the patient. She was seen by PCP yesterday and started on two antibiotics for possible pneumonia. She will finish her antibiotics on 04/06/2024. Repeat scan scheduled for 05/14/2024. Order placed. Results and plan sent to PCP.

## 2024-04-29 ENCOUNTER — Inpatient Hospital Stay: Payer: Medicare PPO | Attending: Oncology | Admitting: Oncology

## 2024-04-29 ENCOUNTER — Encounter: Payer: Self-pay | Admitting: Oncology

## 2024-04-29 VITALS — BP 146/89 | HR 78 | Temp 96.2°F | Resp 16 | Wt 111.3 lb

## 2024-04-29 DIAGNOSIS — D696 Thrombocytopenia, unspecified: Secondary | ICD-10-CM | POA: Insufficient documentation

## 2024-04-29 DIAGNOSIS — F17210 Nicotine dependence, cigarettes, uncomplicated: Secondary | ICD-10-CM | POA: Diagnosis not present

## 2024-04-29 DIAGNOSIS — Z79899 Other long term (current) drug therapy: Secondary | ICD-10-CM | POA: Diagnosis not present

## 2024-04-29 DIAGNOSIS — Z923 Personal history of irradiation: Secondary | ICD-10-CM | POA: Diagnosis not present

## 2024-04-29 DIAGNOSIS — Z8616 Personal history of COVID-19: Secondary | ICD-10-CM | POA: Insufficient documentation

## 2024-04-29 DIAGNOSIS — Z79811 Long term (current) use of aromatase inhibitors: Secondary | ICD-10-CM | POA: Diagnosis not present

## 2024-04-29 DIAGNOSIS — Z86000 Personal history of in-situ neoplasm of breast: Secondary | ICD-10-CM

## 2024-04-29 DIAGNOSIS — Z5181 Encounter for therapeutic drug level monitoring: Secondary | ICD-10-CM

## 2024-04-29 DIAGNOSIS — M810 Age-related osteoporosis without current pathological fracture: Secondary | ICD-10-CM | POA: Insufficient documentation

## 2024-04-29 DIAGNOSIS — Z170 Estrogen receptor positive status [ER+]: Secondary | ICD-10-CM | POA: Insufficient documentation

## 2024-04-29 DIAGNOSIS — D0511 Intraductal carcinoma in situ of right breast: Secondary | ICD-10-CM | POA: Insufficient documentation

## 2024-04-29 DIAGNOSIS — I10 Essential (primary) hypertension: Secondary | ICD-10-CM | POA: Diagnosis not present

## 2024-04-29 DIAGNOSIS — J432 Centrilobular emphysema: Secondary | ICD-10-CM | POA: Diagnosis not present

## 2024-04-29 NOTE — Progress Notes (Signed)
 Pt in for follow up and test results.  Reports had pneumonia show up on lung screening xray in early May and PCP prescribed antibiotics and inhaler.

## 2024-04-29 NOTE — Progress Notes (Signed)
 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 Final pathology showed 15 mm grade 2 DCIS with negative margins. ER more than 90% positive. She had lumpectomy followed by adjuvant radiation therapy and started Arimidex  in June 2024.     Interval history-patient feels that she is more irritable since she has been on Arimidex .  Hair is thinning out.  Denies other complaints.  Denies any breast concerns today  ECOG PS- 1 Pain scale- 0   Review of systems- Review of Systems  Constitutional:  Negative for chills, fever, malaise/fatigue and weight loss.  HENT:  Negative for congestion, ear discharge and nosebleeds.   Eyes:  Negative for blurred vision.  Respiratory:  Negative for cough, hemoptysis, sputum production, shortness of breath and wheezing.   Cardiovascular:  Negative for chest pain, palpitations, orthopnea and claudication.  Gastrointestinal:  Negative for abdominal pain, blood in stool, constipation, diarrhea, heartburn, melena, nausea and vomiting.  Genitourinary:  Negative for dysuria, flank pain, frequency, hematuria and urgency.  Musculoskeletal:  Negative for back pain, joint pain and myalgias.  Skin:  Negative for rash.  Neurological:  Negative for dizziness, tingling, focal weakness, seizures, weakness and headaches.  Endo/Heme/Allergies:  Does not bruise/bleed easily.   Psychiatric/Behavioral:  Negative for depression and suicidal ideas. The patient does not have insomnia.       Allergies  Allergen Reactions   Amoxicillin Rash   Cefdinir Rash   Codeine Nausea Only     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   Cataract    COPD (chronic obstructive pulmonary disease) (HCC)    COVID-19    Depression    Emphysema of lung (HCC) 2022   Heavy alcohol consumption    Hypertension 2022 "white coat syndrome"   Mood disorder (HCC)    Osteopenia    Thrombocytopenia (HCC)    Tobacco use disorder    Tubular adenoma of colon 2019   needs another colonoscopy in 3 years     Past Surgical History:  Procedure Laterality Date   BREAST BIOPSY Right 2014   NEG   BREAST BIOPSY Right 02/27/2023   Right Breast Bx, Ribbon clip - path pending   BREAST BIOPSY Right 02/27/2023   MM RT BREAST BX W LOC DEV 1ST LESION IMAGE BX SPEC STEREO GUIDE 02/27/2023 [facility]   BREAST BIOPSY Right 03/21/2023   Stereo Bx, Coil Clip -  BENIGN MAMMARY GLANDULAR TISSUE WITH INVOLUTIONAL CHANGE, FOCAL COLUMNAR CELL CHANGE, AND CALCIFICATIONS ASSOCIATED WITH FIBROUS STROMA.- NEGATIVE FOR ATYPIA AND MALIGNANCY.   BREAST BIOPSY Right 03/21/2023   Stereo Bx, Ribbon Clip -  BENIGN MAMMARY GLANDULAR TISSUE WITH INVOLUTIONAL CHANGE, FOCAL COLUMNAR CELL CHANGE WITH USUAL EPITHELIAL HYPERPLASIA, AND CALCIFICATIONS ASSOCIATED WITH FIBROUS STROMA. - FOCAL FAT NECROSIS. - NEGATIVE FOR ATYPIA AND MALIGNANC   BREAST  BIOPSY Right 03/21/2023   MM RT BREAST BX W LOC DEV 1ST LESION IMAGE BX SPEC STEREO GUIDE 03/21/2023 [facility]   BREAST BIOPSY Right 03/21/2023   MM RT BREAST BX W LOC DEV EA AD LESION IMG BX SPEC STEREO GUIDE 03/21/2023 [facility]   BREAST BIOPSY Right 04/05/2023   MM RT RADIO FREQUENCY TAG LOC MAMMO GUIDE 04/05/2023 [facility]    BREAST LUMPECTOMY WITH RADIOFREQUENCY TAG IDENTIFICATION Right 04/18/2023   Procedure: BREAST LUMPECTOMY WITH RADIOFREQUENCY TAG IDENTIFICATION;  Surgeon: Flynn Hylan, MD;  Location: [facility];  Service: General;  Laterality: Right;   COLONOSCOPY  09/23/2012   negative   COLONOSCOPY WITH PROPOFOL  N/A 08/09/2018   Procedure: COLONOSCOPY WITH PROPOFOL ;  Surgeon: Cassie Click, MD;  [facility];  Service: Endoscopy;  Laterality: N/A;   FINGER FRACTURE SURGERY Right 1984   FRACTURE SURGERY  rt.middle finger   INDUCED ABORTION     TONSILLECTOMY      Social History   Socioeconomic History   Marital status: Married    Spouse name: Ruffin   Number of children: 1   Years of education: LPN   Highest education level: Associate degree: occupational, Scientist, product/process development, or vocational program  Occupational History   Occupation: retired Geologist, engineering  Tobacco Use   Smoking status: Every Day    Current packs/day: 0.50    Average packs/day: 0.5 packs/day for 51.0 years (25.5 ttl pk-yrs)    Types: Cigarettes    Passive exposure: Past   Smokeless tobacco: Never  Vaping Use   Vaping status: Never Used  Substance and Sexual Activity   Alcohol use: Yes    Alcohol/week: 14.0 standard drinks of alcohol    Types: 14 Cans of beer per week    Comment: 2-3   Drug use: No   Sexual activity: Not Currently    Birth control/protection: Abstinence, Post-menopausal  Other Topics Concern   Not on file  Social History Narrative   Not on file   Social Drivers of Health   Financial Resource Strain: Low Risk  (12/31/2023)   Overall Financial Resource Strain (CARDIA)    Difficulty of Paying Living Expenses: Not hard at all  Food Insecurity: No Food Insecurity (12/31/2023)   Hunger Vital Sign    Worried About Running Out of Food in the Last Year: Never true    Ran Out of Food in the Last Year: Never true  Transportation Needs: No Transportation Needs (12/31/2023)   PRAPARE - Therapist, art (Medical): No    Lack of Transportation (Non-Medical): No  Physical Activity: Insufficiently Active (12/31/2023)   Exercise Vital Sign    Days of Exercise per Week: 1 day    Minutes of Exercise per Session: 20 min  Stress: Stress Concern Present (12/31/2023)   [employer] of Occupational Health - Occupational Stress Questionnaire    Feeling of Stress : To some extent  Social Connections: Socially Integrated (12/31/2023)   Social Connection and Isolation Panel [NHANES]    Frequency of Communication with Friends and Family: Twice a week    Frequency of Social Gatherings with Friends and Family: Once a week    Attends Religious Services: 1 to 4 times per year    Active Member of [employer] or Organizations: Yes    Attends Banker Meetings: 1 to 4 times per year    Marital Status: Married  Catering manager Violence: Not At Risk (04/02/2024)   Humiliation, Afraid, Rape, and Kick questionnaire  Fear of Current or Ex-Partner: No    Emotionally Abused: No    Physically Abused: No    Sexually Abused: No    Family History  Problem Relation Age of Onset   Rheum arthritis Mother    Hypertension Mother    Arthritis Mother    Heart disease Mother    Stroke Mother    Varicose Veins Mother    Lung cancer Father 9   Cancer Father    Diabetes Sister    Graves' disease Sister    Breast cancer Maternal Aunt    Pancreatic cancer Maternal Aunt 56   Cancer Maternal Aunt    Diabetes Maternal Uncle    Heart disease Maternal Uncle    Cancer Paternal Aunt    Breast cancer Cousin 46 - 49     Current Outpatient Medications:    anastrozole  (ARIMIDEX ) 1 MG tablet, TAKE 1 TABLET(1 MG) BY MOUTH DAILY, Disp: 30 tablet, Rfl: 3   buPROPion  (WELLBUTRIN  XL) 300 MG 24 hr tablet, TAKE 1 TABLET(300 MG) BY MOUTH DAILY, Disp: 90 tablet, Rfl: 0   Calcium  Citrate-Vitamin D  (CALCIUM  + D PO), Take 1 capsule by mouth daily., Disp: , Rfl:    Fluticasone-Umeclidin-Vilant (TRELEGY  ELLIPTA) 100-62.5-25 MCG/ACT AEPB, Inhale 1 puff into the lungs daily., Disp: , Rfl:    ibuprofen (ADVIL) 200 MG tablet, Take 200 mg by mouth every 6 (six) hours as needed., Disp: , Rfl:    loratadine  (CLARITIN ) 10 MG tablet, Take 1 tablet (10 mg total) by mouth daily., Disp: 30 tablet, Rfl: 12   Multiple Vitamin (MULTIVITAMIN) capsule, Take 1 capsule by mouth daily., Disp: , Rfl:    rosuvastatin  (CRESTOR ) 20 MG tablet, Take 1 tablet (20 mg total) by mouth daily., Disp: 90 tablet, Rfl: 3  Physical exam:  Vitals:   04/29/24 1118  BP: (!) 146/89  Pulse: 78  Resp: 16  Temp: (!) 96.2 F (35.7 C)  TempSrc: Tympanic  SpO2: 100%  Weight: 111 lb 4.8 oz (50.5 kg)   Physical Exam Cardiovascular:     Rate and Rhythm: Normal rate and regular rhythm.     Heart sounds: Normal heart sounds.  Pulmonary:     Effort: Pulmonary effort is normal.     Breath sounds: Normal breath sounds.  Skin:    General: Skin is warm and dry.  Neurological:     Mental Status: She is alert and oriented to person, place, and time.    Breast exam was performed in seated and lying down position. Patient is status post right lumpectomy with a well-healed surgical scar. No evidence of any palpable masses. No evidence of axillary adenopathy. No evidence of any palpable masses or lumps in the left breast. No evidence of leftt axillary adenopathy   I have personally reviewed labs listed below:    Latest Ref Rng & Units 01/01/2024    9:37 AM  CMP  Glucose 70 - 99 mg/dL 85   BUN 8 - 27 mg/dL 11   Creatinine 1.61 - 1.00 mg/dL 0.96   Sodium 045 - 409 mmol/L 137   Potassium 3.5 - 5.2 mmol/L 4.1   Chloride 96 - 106 mmol/L 98   CO2 20 - 29 mmol/L 25   Calcium  8.7 - 10.3 mg/dL 81.1   Total Protein 6.0 - 8.5 g/dL 7.1   Total Bilirubin 0.0 - 1.2 mg/dL 0.6   Alkaline Phos 44 - 121 IU/L 91   AST 0 - 40 IU/L 32   ALT 0 -  32 IU/L 30       Latest Ref Rng & Units 01/01/2024    9:37 AM  CBC  WBC 3.4 - 10.8 x10E3/uL 4.9    Hemoglobin 11.1 - 15.9 g/dL 57.8   Hematocrit 46.9 - 46.6 % 41.9   Platelets 150 - 450 x10E3/uL 141      Assessment and plan- Patient is a 73 y.o. female with history of right breast DCIS ER positive here for routine follow-up  Patient could not tolerate weekly Fosamax  for her osteoporosis.  She does not want to try monthly Boniva.  She also does not want to try any parenteral bisphosphonates due to concerns for osteonecrosis of the jaw.  We again discussed the following options moving forward  Continue with Arimidex  and repeat bone density scan next year.  If bone density scores are worse we will consider stopping Arimidex  altogether Switching to tamoxifen now or later Stopping endocrine therapy altogether  Patient wishes to continue Arimidex  and get a bone density scan next year.  I will see her back in 1 year with a bone density scan prior.She had a diagnostic bilateral mammogram in March 2025 which showed 2 additional areas of calcifications which were sampled in 2024 with benign results.  Short-term mammogram was recommended in 6 months which we will schedule for September 2025.  Her 5 and 10-year recurrence of DCIS with radiation alone is 3 and 5% respectively and will come down to 1 and 2% with adjuvant endocrine therapy.  Patient understands that her overall benefit of endocrine therapy is small but would like to continue Arimidex  despite not treating her osteoporosis.  We will revisit this next year when we repeat her bone density scan   Visit Diagnosis 1. Visit for monitoring Arimidex  therapy   2. Encounter for follow-up surveillance of ductal carcinoma in situ (DCIS) of breast   3. Osteoporosis of lumbar spine      Dr. Seretha Dance, MD, MPH [facility] [phone] 04/29/2024 12:56 PM

## 2024-05-02 ENCOUNTER — Telehealth: Payer: Self-pay

## 2024-05-02 ENCOUNTER — Other Ambulatory Visit: Payer: Self-pay

## 2024-05-02 DIAGNOSIS — Z79899 Other long term (current) drug therapy: Secondary | ICD-10-CM

## 2024-05-02 DIAGNOSIS — D0511 Intraductal carcinoma in situ of right breast: Secondary | ICD-10-CM

## 2024-05-02 NOTE — Telephone Encounter (Addendum)
 Received secure chat from [employer] , this patient is refusing WB Scan. do you want to call her or me cancel order and make a note".  Outbound call ; spoke to patient, states she thought she was being scheduled for a PET scan. Patient agreed to [facility] scan be done one year prior to her next scheduled appt w/ Dr. Randy Buttery on 04/29/25. Information relayed back to [facility]; no further follow up needed.

## 2024-05-02 NOTE — Telephone Encounter (Signed)
 Clarified with patient order was put in incorrectly.  Clarified bone density scan, not WB scan, has been ordered and that someone from [place] will contact her to schedule accordingly. Patient verbalized agreement.

## 2024-05-14 ENCOUNTER — Ambulatory Visit
Admission: RE | Admit: 2024-05-14 | Discharge: 2024-05-14 | Disposition: A | Discharge status: Home / Self Care | Source: Ambulatory Visit | Attending: Acute Care | Admitting: Acute Care

## 2024-05-14 DIAGNOSIS — Z87891 Personal history of nicotine dependence: Secondary | ICD-10-CM | POA: Diagnosis present

## 2024-05-14 DIAGNOSIS — R911 Solitary pulmonary nodule: Secondary | ICD-10-CM | POA: Diagnosis present

## 2024-05-14 DIAGNOSIS — Z122 Encounter for screening for malignant neoplasm of respiratory organs: Secondary | ICD-10-CM | POA: Diagnosis present

## 2024-05-27 ENCOUNTER — Other Ambulatory Visit: Payer: Self-pay | Admitting: Oncology

## 2024-05-28 ENCOUNTER — Other Ambulatory Visit: Payer: Self-pay | Admitting: Acute Care

## 2024-05-28 DIAGNOSIS — Z122 Encounter for screening for malignant neoplasm of respiratory organs: Secondary | ICD-10-CM

## 2024-05-28 DIAGNOSIS — Z87891 Personal history of nicotine dependence: Secondary | ICD-10-CM

## 2024-06-20 ENCOUNTER — Encounter: Payer: Self-pay | Admitting: Surgery

## 2024-06-25 ENCOUNTER — Other Ambulatory Visit: Payer: Self-pay | Admitting: Family Medicine

## 2024-06-25 DIAGNOSIS — F32A Depression, unspecified: Secondary | ICD-10-CM

## 2024-07-06 ENCOUNTER — Encounter: Payer: Self-pay | Admitting: Family Medicine

## 2024-07-07 ENCOUNTER — Encounter: Payer: Self-pay | Admitting: Family Medicine

## 2024-07-07 ENCOUNTER — Ambulatory Visit: Admitting: Family Medicine

## 2024-07-07 VITALS — BP 121/83 | HR 87 | Resp 16 | Ht 65.0 in | Wt 112.0 lb

## 2024-07-07 DIAGNOSIS — F32A Depression, unspecified: Secondary | ICD-10-CM

## 2024-07-07 DIAGNOSIS — J432 Centrilobular emphysema: Secondary | ICD-10-CM | POA: Diagnosis not present

## 2024-07-07 DIAGNOSIS — J3089 Other allergic rhinitis: Secondary | ICD-10-CM

## 2024-07-07 DIAGNOSIS — Z853 Personal history of malignant neoplasm of breast: Secondary | ICD-10-CM

## 2024-07-07 DIAGNOSIS — I10 Essential (primary) hypertension: Secondary | ICD-10-CM

## 2024-07-07 DIAGNOSIS — R233 Spontaneous ecchymoses: Secondary | ICD-10-CM

## 2024-07-07 DIAGNOSIS — D3501 Benign neoplasm of right adrenal gland: Secondary | ICD-10-CM

## 2024-07-07 DIAGNOSIS — I700 Atherosclerosis of aorta: Secondary | ICD-10-CM

## 2024-07-07 DIAGNOSIS — R454 Irritability and anger: Secondary | ICD-10-CM | POA: Insufficient documentation

## 2024-07-07 DIAGNOSIS — M8589 Other specified disorders of bone density and structure, multiple sites: Secondary | ICD-10-CM

## 2024-07-07 MED ORDER — TRELEGY ELLIPTA 100-62.5-25 MCG/ACT IN AEPB: 1.0000 | INHALATION_SPRAY | Freq: Every day | RESPIRATORY_TRACT | 3 refills | Status: DC

## 2024-07-07 MED ORDER — BUSPIRONE HCL 5 MG PO TABS: 5.0000 mg | ORAL_TABLET | Freq: Two times a day (BID) | ORAL | 2 refills | Status: DC

## 2024-07-07 MED ORDER — LORATADINE 10 MG PO TABS: 10.0000 mg | ORAL_TABLET | Freq: Every day | ORAL | 12 refills | Status: DC

## 2024-07-07 NOTE — Patient Instructions (Addendum)
  VISIT SUMMARY: Today, you came in for a follow-up visit to manage your chronic conditions, including hypertension, emphysema, osteopenia, atherosclerosis, and depression. We reviewed your current symptoms, medications, and recent test results to ensure your treatment plan is effective and make any necessary adjustments.  YOUR PLAN: -CHRONIC HYPERTENSION: Your blood pressure is well-controlled with readings between 116/70 and 128/78, and you are not currently taking any medication for it. We will continue to monitor your blood pressure to ensure it remains stable.  -CENTRAL LOBAR EMPHYSEMA: Emphysema is a lung condition that causes shortness of breath. Your recent CT scan showed some lung changes, but you are not experiencing significant symptoms. We will prescribe a Trelegy inhaler to help manage your symptoms and refill your Claritin  for allergies. Please monitor for any worsening symptoms like increased cough, fever, wheezing, or shortness of breath.  -OSTEOPENIA: Osteopenia is a condition where bone density is lower than normal, which can lead to fractures. You are managing this with calcium  citrate and vitamin D  supplements. No recent bone density results were reported, so we will continue with your current management plan.  -ATHEROSCLEROSIS OF AORTA: Atherosclerosis is the buildup of fats, cholesterol, and other substances in and on your artery walls. You are managing this condition with Crestor  20 mg daily. Your recent labs showed that your vitamin D  level is within the goal range, and your platelet count was 141 six months ago. We will continue with your current medication.  -DEPRESSION AND ANXIETY: You are currently taking Wellbutrin  300 mg daily for depression but have been experiencing increased irritability and depression, possibly due to anastrozole . We discussed the side effects of anastrozole  and are considering transitioning you to buspirone , which is more effective for anxiety. We will  taper your Wellbutrin  and start you on buspirone  5 mg twice daily. Please monitor your mood and anxiety symptoms closely and follow up in one month.  Take 150mg  of Wellbutrin  once daily for 2 weeks, then start 150mg  every other day for 3 weeks followed by 150mg  twice weekly for 3 weeks and then discontinue as long as your depression symptoms are well controlled.   -PETECHIAL RASH OF LOWER LEGS: Petechiae are small red or purple spots caused by bleeding into the skin. You have noticed these on your lower legs, possibly related to a low platelet count. We will order a CBC to check your platelet count and consider a referral to hematology if your platelets are low.  INSTRUCTIONS: Please follow up in one month to monitor your mood and anxiety symptoms after starting buspirone . Additionally, we will check your platelet count with a CBC and consider a referral to hematology if needed. Continue monitoring your blood pressure and report any significant changes or symptoms.                      Contains text generated by [employer]bridge.                                 Contains text generated by Abridge.                       Contains text generated by Abridge.                                 Contains text generated by [employer]bridge.

## 2024-07-07 NOTE — Progress Notes (Signed)
 Established patient visit   Patient: Sandra Franco   DOB: 1950/12/13   73 y.o. Female  MRN: [id number] Visit Date: 07/07/2024  Today's healthcare provider: Rockie Agent, MD   Chief Complaint  Patient presents with   Follow-up    3 month f/u  Pt has some concerns.   Subjective     HPI     Follow-up    Additional comments: 3 month f/u  Pt has some concerns.      Last edited by Marylen Odella CROME, CMA on 07/07/2024 10:31 AM.       Discussed the use of AI scribe software for clinical note transcription with the patient, who gave verbal consent to proceed.  History of Present Illness Sandra Franco is a 73 year old female with hypertension, emphysema, and osteopenia who presents for a chronic follow-up.  She reports home blood pressure readings ranging from 116/70 to 128/78 and is not currently on any antihypertensive medication.  She has a history of central lobar emphysema and was previously treated for community-acquired pneumonia with azithromycin  and cefaxanthin. A CT scan of her chest showed bronchiectasis and scarring of the anterior right lung. She experiences a slight productive cough but no significant shortness of breath or wheezing. She completed a course of Trelegy samples and finds Claritin  helpful for her symptoms.  She has a history of depression and is currently taking Wellbutrin  300 mg daily. She experiences increased irritability and depression, especially during rainy weather, and more hot flashes. She is concerned about the side effects of anastrozole , which she takes 1 mg daily for a history of ductal carcinoma in situ, and has noticed petechiae on her lower legs, which she attributes to the medication.  Her past medical history includes osteopenia, for which she takes calcium  citrate and vitamin D . She also has a history of atherosclerosis of the aorta and takes Crestor  20 mg daily. Her ASCVD score is 18, and her vitamin D  level was 54 six months  ago. Her platelets were 141 at that time, and she notes that they have been low in the past due to clumping.  She mentions that her varicose veins have worsened over the past year, which her dermatologist attributed to aging and gravity.     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   Cataract    COPD (chronic obstructive pulmonary disease) (HCC)    COVID-19    Depression    Emphysema of lung (HCC) 2022   Heavy alcohol consumption    Hypertension 2022 white coat syndrome   Mood disorder (HCC)    Osteopenia    Thrombocytopenia (HCC)    Tobacco use disorder    Tubular adenoma of colon 2019   needs another colonoscopy in 3 years    Medications: Outpatient Medications Prior to Visit  Medication Sig   anastrozole  (ARIMIDEX ) 1 MG tablet TAKE 1 TABLET(1 MG) BY MOUTH DAILY   buPROPion  (WELLBUTRIN  XL) 300 MG 24 hr tablet TAKE 1 TABLET(300 MG) BY MOUTH DAILY   Calcium  Citrate-Vitamin D  (CALCIUM  + D PO) Take 1 capsule by mouth daily.   ibuprofen (ADVIL) 200 MG tablet Take 200 mg by mouth every 6 (six) hours as needed.   Multiple Vitamin (MULTIVITAMIN) capsule Take 1 capsule by mouth daily.   rosuvastatin  (CRESTOR ) 20 MG tablet Take 1 tablet (  20 mg total) by mouth daily.   [DISCONTINUED] loratadine  (CLARITIN ) 10 MG tablet Take 1 tablet (10 mg total) by mouth daily.   [DISCONTINUED] Fluticasone-Umeclidin-Vilant (TRELEGY ELLIPTA ) 100-62.5-25 MCG/ACT AEPB Inhale 1 puff into the lungs daily. (Patient not taking: Reported on 07/07/2024)   No facility-administered medications prior to visit.    Review of Systems  Last CBC Lab Results  Component Value Date   WBC 4.9 01/01/2024   HGB 13.7 01/01/2024   HCT 41.9 01/01/2024   MCV 92 01/01/2024   MCH 30.1 01/01/2024   RDW 13.1 01/01/2024   PLT 141 (L) 01/01/2024   Last metabolic panel Lab Results   Component Value Date   GLUCOSE 85 01/01/2024   NA 137 01/01/2024   K 4.1 01/01/2024   CL 98 01/01/2024   CO2 25 01/01/2024   BUN 11 01/01/2024   CREATININE 0.81 01/01/2024   EGFR 76 01/01/2024   CALCIUM  10.0 01/01/2024   PROT 7.1 01/01/2024   ALBUMIN 4.6 01/01/2024   LABGLOB 2.5 01/01/2024   AGRATIO 1.8 12/26/2021   BILITOT 0.6 01/01/2024   ALKPHOS 91 01/01/2024   AST 32 01/01/2024   ALT 30 01/01/2024   ANIONGAP 8 07/31/2023   Last lipids Lab Results  Component Value Date   CHOL 160 01/01/2024   HDL 90 01/01/2024   LDLCALC 56 01/01/2024   TRIG 70 01/01/2024   CHOLHDL 1.8 01/01/2024   The 10-year ASCVD risk score (Arnett DK, et al., 2019) is: 18.3%  Last hemoglobin A1c Lab Results  Component Value Date   HGBA1C 5.1 01/01/2024   Last thyroid  functions Lab Results  Component Value Date   TSH 1.200 01/01/2024   Last vitamin D  Lab Results  Component Value Date   VD25OH 54.5 01/01/2024   Last vitamin B12 and Folate No results found for: VITAMINB12, FOLATE      Objective    BP 121/83 (BP Location: Right Arm, Patient Position: Sitting, Cuff Size: Normal)   Pulse 87   Resp 16   Ht 5' 5 (1.651 m)   Wt 112 lb (50.8 kg)   SpO2 100%   BMI 18.64 kg/m  BP Readings from Last 3 Encounters:  07/07/24 121/83  04/29/24 (!) 146/89  04/02/24 (!) 150/76   Wt Readings from Last 3 Encounters:  07/07/24 112 lb (50.8 kg)  04/29/24 111 lb 4.8 oz (50.5 kg)  04/02/24 111 lb (50.3 kg)        Physical Exam Vitals reviewed.  Constitutional:      General: She is not in acute distress.    Appearance: Normal appearance. She is not ill-appearing.  Cardiovascular:     Rate and Rhythm: Normal rate and regular rhythm.  Pulmonary:     Effort: Pulmonary effort is normal. No respiratory distress.     Breath sounds: No wheezing, rhonchi or rales.  Neurological:     Mental Status: She is alert and oriented to person, place, and time.  Psychiatric:        Mood and  Affect: Mood normal.        Behavior: Behavior normal.       No results found for any visits on 07/07/24.  Assessment & Plan     Problem List Items Addressed This Visit       Cardiovascular and Mediastinum   HTN (hypertension) - Primary   Relevant Medications   busPIRone  (BUSPAR ) 5 MG tablet   Atherosclerosis of aorta (HCC)     Respiratory   Centrilobular emphysema (HCC)  Relevant Medications   Fluticasone-Umeclidin-Vilant (TRELEGY ELLIPTA ) 100-62.5-25 MCG/ACT AEPB   loratadine  (CLARITIN ) 10 MG tablet     Musculoskeletal and Integument   Osteopenia     Other   Irritability   Relevant Medications   busPIRone  (BUSPAR ) 5 MG tablet   Environmental and seasonal allergies   Relevant Medications   loratadine  (CLARITIN ) 10 MG tablet   Depression   Relevant Medications   busPIRone  (BUSPAR ) 5 MG tablet   Other Visit Diagnoses       History of breast cancer         Adrenal adenoma, right         Petechial rash       Relevant Orders   CBC w/Diff/Platelet        Assessment & Plan Chronic hypertension Blood pressure is well-controlled with readings at home ranging from 116/70 to 128/78. No current antihypertensive medication is being taken.  Central lobar emphysema Recent CT scan showed bronchiectasis and scarring of the anterior right lung. No significant symptoms of shortness of breath or wheezing. Cough is slightly productive but not concerning. - Prescribe Trelegy inhaler - Refill Claritin  as needed for allergic rhinitis - Monitor for symptoms such as increased cough, fever, wheezing, or shortness of breath  Osteopenia Managed with calcium  citrate and vitamin D  supplementation. No recent bone density results reported.  Atherosclerosis of aorta Chronic condition managed with Crestor  20 mg daily. ASCVD score is 18. Recent labs showed vitamin D  within goal range and platelets at 141 six months ago. - Continue Crestor  20 mg daily  Depression and anxiety Current  treatment with Wellbutrin  300 mg daily. Reports increased irritability and depression, possibly related to anastrozole . Discussed potential side effects of anastrozole  including irritability and hot flashes. Considering transition to buspirone  for anxiety and irritability. Buspirone  is more effective for anxiety than depression. - Taper Wellbutrin : 150 mg daily for 2 weeks, then 150 mg every other day for 2.5 weeks, then 150 mg twice a week for 3 weeks, then stop - Start buspirone  5 mg twice daily - Monitor mood and anxiety symptoms closely - Follow up in one month  Petechial rash of lower legs Petechial rash on lower legs, possibly related to low platelet count. History of low platelets due to clumping, but bleeding stops quickly when injured. - Order CBC to check platelet count - Consider referral to hematology if platelets are low     Return in about 1 month (around 08/07/2024) for Anxiety, Depression.         Rockie Agent, MD  [facility] [phone] (phone) [phone] (fax)  [facility]

## 2024-07-08 LAB — CBC WITH DIFFERENTIAL/PLATELET
Basophils Absolute: 0.1 x10E3/uL (ref 0.0–0.2)
Basos: 1 %
EOS (ABSOLUTE): 1.1 x10E3/uL — ABNORMAL HIGH (ref 0.0–0.4)
Eos: 19 %
Hematocrit: 43.2 % (ref 34.0–46.6)
Hemoglobin: 13.8 g/dL (ref 11.1–15.9)
Immature Grans (Abs): 0 x10E3/uL (ref 0.0–0.1)
Immature Granulocytes: 0 %
Lymphocytes Absolute: 1.5 x10E3/uL (ref 0.7–3.1)
Lymphs: 25 %
MCH: 30.4 pg (ref 26.6–33.0)
MCHC: 31.9 g/dL (ref 31.5–35.7)
MCV: 95 fL (ref 79–97)
Monocytes Absolute: 0.5 x10E3/uL (ref 0.1–0.9)
Monocytes: 8 %
Neutrophils Absolute: 2.8 x10E3/uL (ref 1.4–7.0)
Neutrophils: 47 %
RBC: 4.54 x10E6/uL (ref 3.77–5.28)
RDW: 14.2 % (ref 11.7–15.4)
WBC: 5.9 x10E3/uL (ref 3.4–10.8)

## 2024-07-10 ENCOUNTER — Ambulatory Visit: Payer: Self-pay | Admitting: Family Medicine

## 2024-08-13 DIAGNOSIS — Z860100 Personal history of colon polyps, unspecified: Secondary | ICD-10-CM | POA: Diagnosis not present

## 2024-08-13 DIAGNOSIS — K591 Functional diarrhea: Secondary | ICD-10-CM | POA: Diagnosis not present

## 2024-08-14 ENCOUNTER — Ambulatory Visit: Admitting: Family Medicine

## 2024-08-14 ENCOUNTER — Encounter: Payer: Self-pay | Admitting: Family Medicine

## 2024-08-14 VITALS — BP 150/70 | HR 73 | Temp 97.9°F | Ht 65.0 in | Wt 114.3 lb

## 2024-08-14 DIAGNOSIS — F1090 Alcohol use, unspecified, uncomplicated: Secondary | ICD-10-CM | POA: Diagnosis not present

## 2024-08-14 DIAGNOSIS — Z23 Encounter for immunization: Secondary | ICD-10-CM

## 2024-08-14 DIAGNOSIS — R454 Irritability and anger: Secondary | ICD-10-CM | POA: Diagnosis not present

## 2024-08-14 DIAGNOSIS — F32A Depression, unspecified: Secondary | ICD-10-CM

## 2024-08-14 DIAGNOSIS — F3289 Other specified depressive episodes: Secondary | ICD-10-CM | POA: Diagnosis not present

## 2024-08-14 DIAGNOSIS — I10 Essential (primary) hypertension: Secondary | ICD-10-CM

## 2024-08-14 MED ORDER — BUPROPION HCL ER (XL) 150 MG PO TB24: 150.0000 mg | ORAL_TABLET | ORAL | Status: DC

## 2024-08-14 NOTE — Patient Instructions (Signed)
 To keep you healthy, please keep in mind the following health maintenance items that you are due for:   Health Maintenance Due  Topic Date Due   DTaP/Tdap/Td (2 - Td or Tdap) 01/26/2021   Influenza Vaccine  06/27/2024     Best Wishes,   Dr. Lang

## 2024-08-14 NOTE — Progress Notes (Signed)
 Established patient visit   Patient: Sandra Franco   DOB: November 29, 1950   73 y.o. Female  MRN: [id number] Visit Date: 08/14/2024  Today's healthcare provider: Rockie Agent, MD   Chief Complaint  Patient presents with   Medical Management of Chronic Issues    Patient is present for one month follow up. Patient reports that petechial rash is gone    Anxiety   Depression    Patient reports she is not seeing much of a difference. Is still feeling down and it is hard for her to want to do anything. Patient believes she is still struggling with depression.  Also struggles with insomnia, will wake up during the night and have  ahard time going back to sleep.    Subjective     HPI     Medical Management of Chronic Issues    Additional comments: Patient is present for one month follow up. Patient reports that petechial rash is gone         Depression    Additional comments: Patient reports she is not seeing much of a difference. Is still feeling down and it is hard for her to want to do anything. Patient believes she is still struggling with depression.  Also struggles with insomnia, will wake up during the night and have  ahard time going back to sleep.       Last edited by Cherry Chiquita HERO, CMA on 08/14/2024  9:39 AM.       Discussed the use of AI scribe software for clinical note transcription with the patient, who gave verbal consent to proceed.  History of Present Illness Sandra Franco is a 73 year old female with anxiety and depression who presents for follow-up of her symptoms.  She experiences ongoing symptoms of anxiety and depression. She is currently taking buspirone  5 mg twice daily for anxiety and Wellbutrin  150 mg twice a week for depression. Despite this regimen, she does not notice much improvement in her depression symptoms, stating she still feels 'down' and finds it hard to engage in activities. She also reports insomnia, characterized by difficulty  staying asleep, waking up during the night, and having trouble returning to sleep. She has tried over-the-counter remedies like melatonin and Zequil without success.  She has a history of chronic hypertension and is currently taking Crestor  20 mg daily for hyperlipidemia. She mentions that her blood pressure was 142/80 the previous day. She acknowledges that her blood pressure can be affected by anxiety and possibly by Wellbutrin .  She discusses her social situation, mentioning that she has been married for 56 years and dynamics between her and her spouse, she reports as stressful.  She copes with this stress by drinking three to four beers a day, which she acknowledges is above the recommended limit for women. She is not interested in quitting alcohol as it helps her feel 'halfway normal'.  She has a history of a small right adrenal adenoma, which was noted during a lung screening, but was told no follow-up was necessary. She also mentions a past episode of severe depression in the 1970s where she did not want to get out of bed. She denies SI/HI today.   She has a colonoscopy scheduled for October 24th. She also reports a resolved rash on her legs.     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   DCIS 2024   Cataract    COPD (chronic obstructive pulmonary disease) (HCC)    COVID-19    Depression    Emphysema of lung (HCC) 2022   Heavy alcohol consumption    Hypertension 2022 white coat syndrome   Mood disorder (HCC)    Osteopenia    Skin cancer 2021   Thrombocytopenia (HCC)    Tobacco use disorder    Tubular adenoma of colon 2019   needs another colonoscopy in 3 years    Medications: Outpatient Medications Prior to Visit  Medication Sig   anastrozole  (ARIMIDEX ) 1 MG tablet TAKE 1 TABLET(1 MG) BY MOUTH DAILY   busPIRone   (BUSPAR ) 5 MG tablet Take 1 tablet (5 mg total) by mouth 2 (two) times daily.   Calcium  Citrate-Vitamin D  (CALCIUM  + D PO) Take 1 capsule by mouth daily.   Fluticasone-Umeclidin-Vilant (TRELEGY ELLIPTA ) 100-62.5-25 MCG/ACT AEPB Inhale 1 puff into the lungs daily.   ibuprofen (ADVIL) 200 MG tablet Take 200 mg by mouth every 6 (six) hours as needed.   loratadine  (CLARITIN ) 10 MG tablet Take 1 tablet (10 mg total) by mouth daily.   Multiple Vitamin (MULTIVITAMIN) capsule Take 1 capsule by mouth daily.   rosuvastatin  (CRESTOR ) 20 MG tablet Take 1 tablet (20 mg total) by mouth daily.   [DISCONTINUED] buPROPion  (WELLBUTRIN  XL) 300 MG 24 hr tablet TAKE 1 TABLET(300 MG) BY MOUTH DAILY   No facility-administered medications prior to visit.    Review of Systems  Last CBC Lab Results  Component Value Date   WBC 5.9 07/07/2024   HGB 13.8 07/07/2024   HCT 43.2 07/07/2024   MCV 95 07/07/2024   MCH 30.4 07/07/2024   RDW 14.2 07/07/2024   PLT CANCELED 07/07/2024   Last metabolic panel Lab Results  Component Value Date   GLUCOSE 85 01/01/2024   NA 137 01/01/2024   K 4.1 01/01/2024   CL 98 01/01/2024   CO2 25 01/01/2024   BUN 11 01/01/2024   CREATININE 0.81 01/01/2024   EGFR 76 01/01/2024   CALCIUM  10.0 01/01/2024   PROT 7.1 01/01/2024   ALBUMIN 4.6 01/01/2024   LABGLOB 2.5 01/01/2024   AGRATIO 1.8 12/26/2021   BILITOT 0.6 01/01/2024   ALKPHOS 91 01/01/2024   AST 32 01/01/2024   ALT 30 01/01/2024   ANIONGAP 8 07/31/2023   Last lipids Lab Results  Component Value Date   CHOL 160 01/01/2024   HDL 90 01/01/2024   LDLCALC 56 01/01/2024   TRIG 70 01/01/2024   CHOLHDL 1.8 01/01/2024   Last hemoglobin A1c Lab Results  Component Value Date   HGBA1C 5.1 01/01/2024   Last thyroid  functions Lab Results  Component Value Date   TSH 1.200 01/01/2024   Last vitamin D  Lab Results  Component Value Date   VD25OH 54.5 01/01/2024   Last vitamin B12 and Folate No results found for:  VITAMINB12, FOLATE      Objective    BP (!) 150/70 (Cuff Size: Normal)   Pulse 73   Temp 97.9 F (36.6 C) (Oral)   Ht 5' 5 (1.651 m)   Wt 114 lb 4.8 oz (51.8 kg)   SpO2 100%   BMI 19.02 kg/m   BP Readings from Last 3 Encounters:  08/14/24 (!) 150/70  07/07/24 121/83  04/29/24 (!) 146/89   Wt Readings from Last 3 Encounters:  08/14/24 114 lb 4.8 oz (51.8 kg)  07/07/24 112 lb (50.8 kg)  04/29/24 111 lb 4.8 oz (50.5 kg)        Physical Exam Vitals reviewed.  Constitutional:      General: She is not in acute distress.    Appearance: Normal appearance. She is not ill-appearing.  Cardiovascular:     Rate and Rhythm: Normal rate and regular rhythm.  Pulmonary:     Effort: Pulmonary effort is normal. No respiratory distress.     Breath sounds: No wheezing, rhonchi or rales.  Neurological:     Mental Status: She is alert and oriented to person, place, and time.  Psychiatric:        Mood and Affect: Mood normal.        Behavior: Behavior normal.       No results found for any visits on 08/14/24.  Assessment & Plan     Problem List Items Addressed This Visit     Depression - Primary   Depression with anxiety  Chronic depression and anxiety with minimal improvement on current regimen. Persistent depressive symptoms, including feeling down, lack of motivation, and insomnia. Hesitant to start new medications like Rexulti due to cost and insurance coverage concerns. Previous counseling was not beneficial, and she is not interested in resuming therapy. Alcohol use may interfere with anxiety management. - Continue buspirone  5 mg twice daily. - Taper Wellbutrin  to twice a week. -pt declines psychiatry referral  - Consider starting Rexulti if symptoms do not improve, pending insurance coverage and cost. - Encourage follow-up in November to reassess medication efficacy and consider further adjustments. - Discuss risks of excessive alcohol consumption, including impact  on blood pressure and potential interactions with buspirone . - Encourage reduction in alcohol intake to within recommended limits.      Relevant Medications   buPROPion  (WELLBUTRIN  XL) 150 MG 24 hr tablet   Heavy alcohol consumption   Chronic  Extensive discussion on risk of these patterns  Patient declines stating that the alcohol helps her to cope  We discussed importance of regular follow up given changes to medication regimen, especially while consuming alcohol on buspar , pt voiced understanding and opted to contaqct the office with updates if she has adverse effects rather than schedule follow up today       HTN (hypertension)   Chronic hypertension with recent elevated readings (149/78 mmHg and 142/80 mmHg). Blood pressure is not consistently controlled, possibly influenced by anxiety, Wellbutrin , and lifestyle factors such as alcohol use. There is a small right adrenal adenoma, but no follow-up is deemed necessary. Elevated blood pressure poses risks for heart attack, stroke, and heart failure. - Monitor blood pressure at home regularly. - Encourage lifestyle modifications including reducing salt intake, increasing physical activity, and reducing alcohol consumption. - Schedule follow-up if home blood pressure readings remain elevated.      Irritability   Other Visit Diagnoses       Immunization due       Relevant Orders   Flu vaccine HIGH DOSE PF(Fluzone Trivalent) (Completed)       Assessment and Plan Assessment & Plan   Insomnia Chronic insomnia characterized by difficulty staying asleep, waking up in the middle of the night, and inability to return to sleep. Previous trials of melatonin and Zequil were ineffective or caused adverse effects. Insomnia is likely exacerbated by anxiety and depression. - Continue current management and monitor for improvement with buspirone .   Hyperlipidemia Chronic hyperlipidemia managed with Crestor  (rosuvastatin ) 20 mg daily. -  Continue Crestor  20 mg daily.     Return  in about 3 months (around 11/13/2024) for Depression, Anxiety.         Rockie Agent, MD  [facility] [phone] (phone) [phone] (fax)  [facility]

## 2024-08-16 NOTE — Assessment & Plan Note (Signed)
 Chronic  Extensive discussion on risk of these patterns  Patient declines stating that the alcohol helps her to cope  We discussed importance of regular follow up given changes to medication regimen, especially while consuming alcohol on buspar , pt voiced understanding and opted to contaqct the office with updates if she has adverse effects rather than schedule follow up today

## 2024-08-16 NOTE — Assessment & Plan Note (Signed)
 Depression with anxiety  Chronic depression and anxiety with minimal improvement on current regimen. Persistent depressive symptoms, including feeling down, lack of motivation, and insomnia. Hesitant to start new medications like Rexulti due to cost and insurance coverage concerns. Previous counseling was not beneficial, and she is not interested in resuming therapy. Alcohol use may interfere with anxiety management. - Continue buspirone  5 mg twice daily. - Taper Wellbutrin  to twice a week. -pt declines psychiatry referral  - Consider starting Rexulti if symptoms do not improve, pending insurance coverage and cost. - Encourage follow-up in November to reassess medication efficacy and consider further adjustments. - Discuss risks of excessive alcohol consumption, including impact on blood pressure and potential interactions with buspirone . - Encourage reduction in alcohol intake to within recommended limits.

## 2024-08-16 NOTE — Assessment & Plan Note (Signed)
 Chronic hypertension with recent elevated readings (149/78 mmHg and 142/80 mmHg). Blood pressure is not consistently controlled, possibly influenced by anxiety, Wellbutrin , and lifestyle factors such as alcohol use. There is a small right adrenal adenoma, but no follow-up is deemed necessary. Elevated blood pressure poses risks for heart attack, stroke, and heart failure. - Monitor blood pressure at home regularly. - Encourage lifestyle modifications including reducing salt intake, increasing physical activity, and reducing alcohol consumption. - Schedule follow-up if home blood pressure readings remain elevated.

## 2024-08-21 ENCOUNTER — Ambulatory Visit
Admission: RE | Admit: 2024-08-21 | Discharge: 2024-08-21 | Disposition: A | Discharge status: Home / Self Care | Source: Ambulatory Visit | Attending: Oncology | Admitting: Oncology

## 2024-08-21 DIAGNOSIS — R92333 Mammographic heterogeneous density, bilateral breasts: Secondary | ICD-10-CM | POA: Diagnosis not present

## 2024-08-21 DIAGNOSIS — D0511 Intraductal carcinoma in situ of right breast: Secondary | ICD-10-CM | POA: Diagnosis not present

## 2024-08-21 DIAGNOSIS — R921 Mammographic calcification found on diagnostic imaging of breast: Secondary | ICD-10-CM | POA: Diagnosis not present

## 2024-08-21 DIAGNOSIS — Z86000 Personal history of in-situ neoplasm of breast: Secondary | ICD-10-CM | POA: Insufficient documentation

## 2024-08-21 DIAGNOSIS — Z08 Encounter for follow-up examination after completed treatment for malignant neoplasm: Secondary | ICD-10-CM | POA: Insufficient documentation

## 2024-08-22 ENCOUNTER — Ambulatory Visit: Payer: Self-pay | Admitting: Oncology

## 2024-08-22 ENCOUNTER — Other Ambulatory Visit: Payer: Self-pay

## 2024-08-22 DIAGNOSIS — D0511 Intraductal carcinoma in situ of right breast: Secondary | ICD-10-CM

## 2024-08-22 NOTE — Telephone Encounter (Signed)
 Per Dr. Melanee Please schedule bilateral diagnostic mammogram in 6 months as recommended by radiology.

## 2024-08-22 NOTE — Telephone Encounter (Signed)
-----   Message from Annah JAYSON Skene sent at 08/22/2024  8:44 AM EDT ----- Please schedule bilateral diagnostic mammogram in 6 months as recommended by radiology ----- Message ----- From: Interface, Rad Results In Sent: 08/21/2024  12:58 PM EDT To: Annah JAYSON Skene, MD

## 2024-08-28 ENCOUNTER — Ambulatory Visit: Admitting: Surgery

## 2024-09-04 ENCOUNTER — Encounter: Payer: Self-pay | Admitting: *Deleted

## 2024-09-19 ENCOUNTER — Ambulatory Visit
Admission: RE | Admit: 2024-09-19 | Discharge: 2024-09-19 | Disposition: A | Discharge status: Home / Self Care | Attending: Gastroenterology | Admitting: Gastroenterology

## 2024-09-19 ENCOUNTER — Ambulatory Visit: Admitting: Certified Registered Nurse Anesthetist

## 2024-09-19 ENCOUNTER — Encounter: Admission: RE | Disposition: A | Payer: Self-pay | Source: Home / Self Care | Attending: Gastroenterology

## 2024-09-19 ENCOUNTER — Other Ambulatory Visit: Payer: Self-pay

## 2024-09-19 DIAGNOSIS — Z1211 Encounter for screening for malignant neoplasm of colon: Secondary | ICD-10-CM | POA: Diagnosis not present

## 2024-09-19 DIAGNOSIS — Z860100 Personal history of colon polyps, unspecified: Secondary | ICD-10-CM | POA: Diagnosis not present

## 2024-09-19 DIAGNOSIS — Z860101 Personal history of adenomatous and serrated colon polyps: Secondary | ICD-10-CM | POA: Insufficient documentation

## 2024-09-19 DIAGNOSIS — I10 Essential (primary) hypertension: Secondary | ICD-10-CM | POA: Insufficient documentation

## 2024-09-19 DIAGNOSIS — F17200 Nicotine dependence, unspecified, uncomplicated: Secondary | ICD-10-CM | POA: Insufficient documentation

## 2024-09-19 DIAGNOSIS — K5730 Diverticulosis of large intestine without perforation or abscess without bleeding: Secondary | ICD-10-CM | POA: Diagnosis not present

## 2024-09-19 DIAGNOSIS — F17210 Nicotine dependence, cigarettes, uncomplicated: Secondary | ICD-10-CM | POA: Diagnosis not present

## 2024-09-19 DIAGNOSIS — Z09 Encounter for follow-up examination after completed treatment for conditions other than malignant neoplasm: Secondary | ICD-10-CM | POA: Diagnosis not present

## 2024-09-19 DIAGNOSIS — J449 Chronic obstructive pulmonary disease, unspecified: Secondary | ICD-10-CM | POA: Insufficient documentation

## 2024-09-19 SURGERY — COLONOSCOPY
Anesthesia: General

## 2024-09-19 MED ORDER — SODIUM CHLORIDE 0.9 % IV SOLN: INTRAVENOUS | Status: DC

## 2024-09-19 MED ORDER — PROPOFOL 10 MG/ML IV BOLUS
INTRAVENOUS | Status: DC | PRN
Start: 2024-09-19 — End: 2024-09-19
  Administered 2024-09-19: 70 mg via INTRAVENOUS

## 2024-09-19 MED ORDER — PROPOFOL 500 MG/50ML IV EMUL
INTRAVENOUS | Status: DC | PRN
  Administered 2024-09-19: 150 ug/kg/min via INTRAVENOUS

## 2024-09-19 NOTE — Anesthesia Procedure Notes (Signed)
 Date/Time: 09/19/2024 10:31 AM  Performed by: Duwayne Craven, CRNAPre-anesthesia Checklist: Patient identified, Emergency Drugs available, Suction available, Patient being monitored and Timeout performed Patient Re-evaluated:Patient Re-evaluated prior to induction Oxygen Delivery Method: Nasal cannula Induction Type: IV induction Placement Confirmation: CO2 detector and positive ETCO2

## 2024-09-19 NOTE — Op Note (Signed)
 [facility] Patient Name: Sandra Franco Procedure Date: 09/19/2024 10:17 AM MRN: [id number] Account #: [id number] Date of Birth: 1951/09/21 Admit Type: Outpatient Age: 73 Room: [facility] ENDO ROOM 3 Gender: Female Note Status: Finalized Instrument Name: Peds Colonoscope [id number] Procedure:             Colonoscopy Indications:           Surveillance: Personal history of adenomatous polyps                         on last colonoscopy > 5 years ago Providers:             Ole Schick MD, MD Medicines:             Monitored Anesthesia Care Complications:         No immediate complications. Procedure:             Pre-Anesthesia Assessment:                        - Prior to the procedure, a History and Physical was                         performed, and patient medications and allergies were                         reviewed. The patient is competent. The risks and                         benefits of the procedure and the sedation options and                         risks were discussed with the patient. All questions                         were answered and informed consent was obtained.                         Patient identification and proposed procedure were                         verified by the physician, the nurse, the                         anesthesiologist, the anesthetist and the technician                         in the endoscopy suite. Mental Status Examination:                         alert and oriented. Airway Examination: normal                         oropharyngeal airway and neck mobility. Respiratory                         Examination: clear to auscultation. CV Examination:                         normal. Prophylactic Antibiotics: The patient does not  require prophylactic antibiotics. Prior                         Anticoagulants: The patient has taken no anticoagulant                         or antiplatelet  agents. ASA Grade Assessment: III - A                         patient with severe systemic disease. After reviewing                         the risks and benefits, the patient was deemed in                         satisfactory condition to undergo the procedure. The                         anesthesia plan was to use monitored anesthesia care                         (MAC). Immediately prior to administration of                         medications, the patient was re-assessed for adequacy                         to receive sedatives. The heart rate, respiratory                         rate, oxygen saturations, blood pressure, adequacy of                         pulmonary ventilation, and response to care were                         monitored throughout the procedure. The physical                         status of the patient was re-assessed after the                         procedure.                        After obtaining informed consent, the colonoscope was                         passed under direct vision. Throughout the procedure,                         the patient's blood pressure, pulse, and oxygen                         saturations were monitored continuously. The                         Colonoscope was introduced through the anus and  advanced to the the terminal ileum, with                         identification of the appendiceal orifice and IC                         valve. The colonoscopy was somewhat difficult due to a                         redundant colon. The patient tolerated the procedure                         well. The quality of the bowel preparation was                         adequate to identify polyps. The terminal ileum,                         ileocecal valve, appendiceal orifice, and rectum were                         photographed. Findings:      The perianal and digital rectal examinations were normal.      The terminal ileum  appeared normal.      A few small-mouthed diverticula were found in the sigmoid colon.      The exam was otherwise without abnormality on direct and retroflexion       views. Impression:            - The examined portion of the ileum was normal.                        - Diverticulosis in the sigmoid colon.                        - The examination was otherwise normal on direct and                         retroflexion views.                        - No specimens collected. Recommendation:        - Discharge patient to home.                        - Resume previous diet.                        - Continue present medications.                        - Repeat colonoscopy is not recommended due to current                         age (74 years or older) for surveillance.                        - Return to referring physician as previously  scheduled. Procedure Code(s):     --- Professional ---                        H9894, Colorectal cancer screening; colonoscopy on                         individual at high risk Diagnosis Code(s):     --- Professional ---                        Z86.010, Personal history of colonic polyps                        K57.30, Diverticulosis of large intestine without                         perforation or abscess without bleeding CPT copyright 2022 American Medical Association. All rights reserved. The codes documented in this report are preliminary and upon coder review may  be revised to meet current compliance requirements. Ole Schick MD, MD 09/19/2024 10:56:49 AM Number of Addenda: 0 Note Initiated On: 09/19/2024 10:17 AM Scope Withdrawal Time: 0 hours 6 minutes 57 seconds  Total Procedure Duration: 0 hours 16 minutes 4 seconds  Estimated Blood Loss:  Estimated blood loss: none.      [facility]

## 2024-09-19 NOTE — Transfer of Care (Signed)
 Immediate Anesthesia Transfer of Care Note  Patient: Sandra Franco  Procedure(s) Performed: COLONOSCOPY  Patient Location: PACU  Anesthesia Type:General  Level of Consciousness: awake and alert   Airway & Oxygen Therapy: Patient Spontanous Breathing  Post-op Assessment: Report given to RN and Post -op Vital signs reviewed and stable  Post vital signs: Reviewed and stable  Last Vitals:  Vitals Value Taken Time  BP    Temp    Pulse 86 09/19/24 10:52  Resp 14 09/19/24 10:52  SpO2 100 % 09/19/24 10:52  Vitals shown include unfiled device data.  Last Pain:  Vitals:   09/19/24 1000  TempSrc: Temporal  PainSc: 0-No pain         Complications: No notable events documented.

## 2024-09-19 NOTE — H&P (Signed)
 Outpatient short stay form Pre-procedure 09/19/2024  Ole ONEIDA Schick, MD  Primary Physician: Sharma Coyer, MD  Reason for visit:  Surveillance  History of present illness:    73 y/o lady with history of HLD, alcohol use, and COPD here for surveillance colonoscopy. No blood thinners. No significant abdominal surgeries. No family history of GI malignancies.    Current Facility-Administered Medications:    0.9 %  sodium chloride  infusion, , Intravenous, Continuous, Tonette Koehne, Ole ONEIDA, MD, Last Rate: 20 mL/hr at 09/19/24 1014, Continued from Pre-op at 09/19/24 1014  Medications Prior to Admission  Medication Sig Dispense Refill Last Dose/Taking   anastrozole  (ARIMIDEX ) 1 MG tablet TAKE 1 TABLET(1 MG) BY MOUTH DAILY 30 tablet 3 09/18/2024   busPIRone  (BUSPAR ) 5 MG tablet Take 1 tablet (5 mg total) by mouth 2 (two) times daily. 120 tablet 2 09/18/2024   Calcium  Citrate-Vitamin D  (CALCIUM  + D PO) Take 1 capsule by mouth daily.   09/18/2024   Fluticasone-Umeclidin-Vilant (TRELEGY ELLIPTA ) 100-62.5-25 MCG/ACT AEPB Inhale 1 puff into the lungs daily. 60 each 3 09/19/2024 at  6:00 AM   loratadine  (CLARITIN ) 10 MG tablet Take 1 tablet (10 mg total) by mouth daily. 30 tablet 12 09/19/2024 at  6:00 AM   Multiple Vitamin (MULTIVITAMIN) capsule Take 1 capsule by mouth daily.   Past Week   rosuvastatin  (CRESTOR ) 20 MG tablet Take 1 tablet (20 mg total) by mouth daily. 90 tablet 3 09/18/2024   buPROPion  (WELLBUTRIN  XL) 150 MG 24 hr tablet Take 1 tablet (150 mg total) by mouth 2 (two) times a week. (Patient not taking: Reported on 09/19/2024)   Not Taking   ibuprofen (ADVIL) 200 MG tablet Take 200 mg by mouth every 6 (six) hours as needed.        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    Physical Exam  Gen: Alert, oriented. Appears stated age.  HEENT: PERRLA. Lungs: No respiratory distress CV: RRR Abd: soft, benign, no masses Ext: No edema    Planned procedures: Proceed with colonoscopy. The patient understands the nature of the planned procedure, indications, risks, alternatives and potential complications including but not limited to bleeding, infection, perforation, damage to internal organs and possible oversedation/side effects from anesthesia. The patient agrees and gives consent to proceed.  Please refer to procedure notes for findings, recommendations and patient disposition/instructions.     Ole ONEIDA Schick, MD [facility]astroenterology

## 2024-09-19 NOTE — Interval H&P Note (Signed)
 History and Physical Interval Note:  09/19/2024 10:28 AM  Sandra Franco  has presented today for surgery, with the diagnosis of Personal history of colon polyps, unspecified [Z86.0100].  The various methods of treatment have been discussed with the patient and family. After consideration of risks, benefits and other options for treatment, the patient has consented to  Procedure(s): COLONOSCOPY (N/A) as a surgical intervention.  The patient's history has been reviewed, patient examined, no change in status, stable for surgery.  I have reviewed the patient's chart and labs.  Questions were answered to the patient's satisfaction.     Ole ONEIDA Schick  Ok to proceed with colonoscopy

## 2024-09-19 NOTE — Anesthesia Preprocedure Evaluation (Addendum)
 Anesthesia Evaluation  Patient identified by MRN, date of birth, ID band Patient awake    Reviewed: Allergy & Precautions, NPO status , Patient's Chart, lab work & pertinent test results  History of Anesthesia Complications Negative for: history of anesthetic complications  Airway Mallampati: III  TM Distance: >3 FB Neck ROM: full    Dental no notable dental hx. (+) Partial Lower, Partial Upper   Pulmonary COPD, Current Smoker and Patient abstained from smoking.   Pulmonary exam normal        Cardiovascular hypertension, Normal cardiovascular exam     Neuro/Psych  PSYCHIATRIC DISORDERS      negative neurological ROS     GI/Hepatic negative GI ROS,,,(+)     substance abuse  alcohol use  Endo/Other  negative endocrine ROS    Renal/GU negative Renal ROS  negative genitourinary   Musculoskeletal   Abdominal   Peds  Hematology negative hematology ROS (+)   Anesthesia Other Findings Past Medical History: cefdinir 300 mg.-rash: Allergy     Comment:  amoxicillin-rash at large doses March 2024: Breast cancer (HCC) basal and squamous skin cancer in the past: Cancer [facility])     Comment:  DCIS 2024 No date: Cataract No date: COPD (chronic obstructive pulmonary disease) (HCC) No date: COVID-19 No date: Depression 2022: Emphysema of lung (HCC) No date: Heavy alcohol consumption 2022 white coat syndrome: Hypertension No date: Mood disorder No date: Osteopenia 2021: Skin cancer No date: Thrombocytopenia No date: Tobacco use disorder 2019: Tubular adenoma of colon     Comment:  needs another colonoscopy in 3 years  Past Surgical History: 2014: BREAST BIOPSY; Right     Comment:  NEG 02/27/2023: BREAST BIOPSY; Right     Comment:  Right Breast Bx, Ribbon clip - path pending 02/27/2023: BREAST BIOPSY; Right     Comment:  MM RT BREAST BX W LOC DEV 1ST LESION IMAGE BX SPEC               STEREO GUIDE 02/27/2023  [facility] 03/21/2023: BREAST BIOPSY; Right     Comment:  Stereo Bx, Coil Clip -  BENIGN MAMMARY GLANDULAR TISSUE               WITH INVOLUTIONAL CHANGE, FOCAL COLUMNAR CELL CHANGE, AND              CALCIFICATIONS ASSOCIATED WITH FIBROUS STROMA.- NEGATIVE               FOR ATYPIA AND MALIGNANCY. 03/21/2023: BREAST BIOPSY; Right     Comment:  Stereo Bx, Ribbon Clip -  BENIGN MAMMARY GLANDULAR               TISSUE WITH INVOLUTIONAL CHANGE, FOCAL COLUMNAR CELL               CHANGE WITH USUAL EPITHELIAL HYPERPLASIA, AND               CALCIFICATIONS ASSOCIATED WITH FIBROUS STROMA. - FOCAL               FAT NECROSIS. - NEGATIVE FOR ATYPIA AND MALIGNANC 03/21/2023: BREAST BIOPSY; Right     Comment:  MM RT BREAST BX W LOC DEV 1ST LESION IMAGE BX SPEC               STEREO GUIDE 03/21/2023 [facility] 03/21/2023: BREAST BIOPSY; Right     Comment:  MM RT BREAST BX W LOC DEV EA AD LESION IMG BX SPEC  STEREO GUIDE 03/21/2023 [facility] 04/05/2023: BREAST BIOPSY; Right     Comment:  MM RT RADIO FREQUENCY TAG LOC MAMMO GUIDE 04/05/2023               [facility] 04/18/2023: BREAST LUMPECTOMY WITH RADIOFREQUENCY TAG IDENTIFICATION;  Right     Comment:  Procedure: BREAST LUMPECTOMY WITH RADIOFREQUENCY TAG               IDENTIFICATION;  Surgeon: Lane Shope, MD;                Location: [facility] ORS;  Service: General;  Laterality:               Right; 09/23/2012: COLONOSCOPY     Comment:  negative 08/09/2018: COLONOSCOPY WITH PROPOFOL ; N/A     Comment:  Procedure: COLONOSCOPY WITH PROPOFOL ;  Surgeon: Viktoria Lamar DASEN, MD;  Location: [facility] ENDOSCOPY;  Service:               Endoscopy;  Laterality: N/A; 1984: FINGER FRACTURE SURGERY; Right rt.middle finger: FRACTURE SURGERY No date: INDUCED ABORTION No date: TONSILLECTOMY     Reproductive/Obstetrics negative OB ROS                              Anesthesia Physical Anesthesia  Plan  ASA: 3  Anesthesia Plan: General   Post-op Pain Management: Minimal or no pain anticipated   Induction: Intravenous  PONV Risk Score and Plan: 2 and Propofol  infusion and TIVA  Airway Management Planned: Natural Airway and Nasal Cannula  Additional Equipment:   Intra-op Plan:   Post-operative Plan:   Informed Consent: I have reviewed the patients History and Physical, chart, labs and discussed the procedure including the risks, benefits and alternatives for the proposed anesthesia with the patient or authorized representative who has indicated his/her understanding and acceptance.     Dental Advisory Given  Plan Discussed with: Anesthesiologist, CRNA and Surgeon  Anesthesia Plan Comments: (Patient consented for risks of anesthesia including but not limited to:  - adverse reactions to medications - risk of airway placement if required - damage to eyes, teeth, lips or other oral mucosa - nerve damage due to positioning  - sore throat or hoarseness - Damage to heart, brain, nerves, lungs, other parts of body or loss of life  Patient voiced understanding and assent.)         Anesthesia Quick Evaluation

## 2024-09-19 NOTE — Anesthesia Postprocedure Evaluation (Signed)
 Anesthesia Post Note  Patient: Sandra Franco  Procedure(s) Performed: COLONOSCOPY  Patient location during evaluation: Endoscopy Anesthesia Type: General Level of consciousness: awake and alert Pain management: pain level controlled Vital Signs Assessment: post-procedure vital signs reviewed and stable Respiratory status: spontaneous breathing, nonlabored ventilation, respiratory function stable and patient connected to nasal cannula oxygen Cardiovascular status: blood pressure returned to baseline and stable Postop Assessment: no apparent nausea or vomiting Anesthetic complications: no   No notable events documented.   Last Vitals:  Vitals:   09/19/24 1052 09/19/24 1102  BP: 129/83 (!) 143/75  Pulse: 87 80  Resp: 18 (!) 9  Temp: (!) 36.1 C   SpO2: 100% 100%    Last Pain:  Vitals:   09/19/24 1102  TempSrc:   PainSc: 0-No pain                 [name]

## 2024-09-24 ENCOUNTER — Other Ambulatory Visit: Payer: Self-pay | Admitting: Family Medicine

## 2024-09-24 DIAGNOSIS — F32A Depression, unspecified: Secondary | ICD-10-CM

## 2024-09-29 ENCOUNTER — Ambulatory Visit: Payer: Self-pay

## 2024-09-29 NOTE — Telephone Encounter (Signed)
 Patient call note and symptoms reviewed. Agree with scheduled appt. Will evaluate during OV

## 2024-09-29 NOTE — Telephone Encounter (Signed)
 FYI Only or Action Required?: Action required by provider: request for appointment.  Patient was last seen in primary care on 08/14/2024 by Sharma Coyer, MD.  Called Nurse Triage reporting Cough.  Symptoms began several days ago.  Interventions attempted: Rest, hydration, or home remedies.  Symptoms are: gradually worsening. Productive cough with thick yellow mucus.  Triage Disposition: See Physician Within 24 Hours  Patient/caregiver understands and will follow disposition?: Yes    Copied from CRM [id number]. Topic: Clinical - Red Word Triage >> Sep 29, 2024 10:53 AM Treva T wrote: [facility] that prompted transfer to Nurse Triage: Patient reports she has a cold, productive cough, and coughing up thick colored mucous, with shortness of breath.   Patient requesting an appointment with provider for evaluation as soon as possible. Answer Assessment - Initial Assessment Questions 1. ONSET: When did the cough begin?      10 days ago 2. SEVERITY: How bad is the cough today?      severe 3. SPUTUM: Describe the color of your sputum (e.g., none, dry cough; clear, white, yellow, green)     Thick yellow 4. HEMOPTYSIS: Are you coughing up any blood? If Yes, ask: How much? (e.g., flecks, streaks, tablespoons, etc.)     no 5. DIFFICULTY BREATHING: Are you having difficulty breathing? If Yes, ask: How bad is it? (e.g., mild, moderate, severe)      no 6. FEVER: Do you have a fever? If Yes, ask: What is your temperature, how was it measured, and when did it start?     no 7. CARDIAC HISTORY: Do you have any history of heart disease? (e.g., heart attack, congestive heart failure)      no 8. LUNG HISTORY: Do you have any history of lung disease?  (e.g., pulmonary embolus, asthma, emphysema)     COPD 9. PE RISK FACTORS: Do you have a history of blood clots? (or: recent major surgery, recent prolonged travel, bedridden)     no 10. OTHER SYMPTOMS: Do you have any  other symptoms? (e.g., runny nose, wheezing, chest pain)       no 11. PREGNANCY: Is there any chance you are pregnant? When was your last menstrual period?       no 12. TRAVEL: Have you traveled out of the country in the last month? (e.g., travel history, exposures)       no  Protocols used: Cough - Acute Productive-A-AH  Reason for Disposition  [1] Continuous (nonstop) coughing interferes with work or school AND [2] no improvement using cough treatment per Care Advice  Answer Assessment - Initial Assessment Questions 1. ONSET: When did the cough begin?      10 days ago 2. SEVERITY: How bad is the cough today?      severe 3. SPUTUM: Describe the color of your sputum (e.g., none, dry cough; clear, white, yellow, green)     Thick yellow 4. HEMOPTYSIS: Are you coughing up any blood? If Yes, ask: How much? (e.g., flecks, streaks, tablespoons, etc.)     no 5. DIFFICULTY BREATHING: Are you having difficulty breathing? If Yes, ask: How bad is it? (e.g., mild, moderate, severe)      no 6. FEVER: Do you have a fever? If Yes, ask: What is your temperature, how was it measured, and when did it start?     no 7. CARDIAC HISTORY: Do you have any history of heart disease? (e.g., heart attack, congestive heart failure)      no 8. LUNG HISTORY: Do you  have any history of lung disease?  (e.g., pulmonary embolus, asthma, emphysema)     COPD 9. PE RISK FACTORS: Do you have a history of blood clots? (or: recent major surgery, recent prolonged travel, bedridden)     no 10. OTHER SYMPTOMS: Do you have any other symptoms? (e.g., runny nose, wheezing, chest pain)       no 11. PREGNANCY: Is there any chance you are pregnant? When was your last menstrual period?       no 12. TRAVEL: Have you traveled out of the country in the last month? (e.g., travel history, exposures)       no  Protocols used: Cough - Acute Productive-A-AH

## 2024-09-30 ENCOUNTER — Ambulatory Visit: Admitting: Family Medicine

## 2024-09-30 ENCOUNTER — Other Ambulatory Visit: Payer: Self-pay | Admitting: Oncology

## 2024-09-30 VITALS — BP 132/80 | HR 82 | Temp 99.0°F | Ht 65.0 in | Wt 113.9 lb

## 2024-09-30 DIAGNOSIS — D0511 Intraductal carcinoma in situ of right breast: Secondary | ICD-10-CM

## 2024-09-30 DIAGNOSIS — I10 Essential (primary) hypertension: Secondary | ICD-10-CM

## 2024-09-30 DIAGNOSIS — J3089 Other allergic rhinitis: Secondary | ICD-10-CM | POA: Diagnosis not present

## 2024-09-30 DIAGNOSIS — F3289 Other specified depressive episodes: Secondary | ICD-10-CM

## 2024-09-30 DIAGNOSIS — J439 Emphysema, unspecified: Secondary | ICD-10-CM | POA: Diagnosis not present

## 2024-09-30 DIAGNOSIS — J441 Chronic obstructive pulmonary disease with (acute) exacerbation: Secondary | ICD-10-CM

## 2024-09-30 LAB — POCT INFLUENZA A/B
Influenza A, POC: NEGATIVE
Influenza B, POC: NEGATIVE

## 2024-09-30 LAB — POC COVID19 BINAXNOW: SARS Coronavirus 2 Ag: NEGATIVE

## 2024-09-30 MED ORDER — PREDNISONE 20 MG PO TABS: 40.0000 mg | ORAL_TABLET | Freq: Every day | ORAL | 0 refills | Status: AC

## 2024-09-30 MED ORDER — ANASTROZOLE 1 MG PO TABS
1.0000 mg | ORAL_TABLET | Freq: Every day | ORAL | 3 refills | Status: AC
Start: 2024-09-30 — End: ?

## 2024-09-30 MED ORDER — DOXYCYCLINE HYCLATE 100 MG PO TABS: 100.0000 mg | ORAL_TABLET | Freq: Two times a day (BID) | ORAL | 0 refills | Status: AC

## 2024-09-30 MED ORDER — LORATADINE 10 MG PO TABS: 10.0000 mg | ORAL_TABLET | Freq: Every day | ORAL | 12 refills | Status: AC

## 2024-09-30 NOTE — Patient Instructions (Signed)
 To keep you healthy, please keep in mind the following health maintenance items that you are due for:   Health Maintenance Due  Topic Date Due   DTaP/Tdap/Td (2 - Td or Tdap) 01/26/2021     Best Wishes,   Dr. Lang

## 2024-09-30 NOTE — Progress Notes (Signed)
 ACUTE VISIT   Patient: Sandra Franco   DOB: January 15, 1951   73 y.o. Female  MRN: [id number]   PCP: Sharma Coyer, MD  Chief Complaint  Patient presents with   Cough    Patient presents with productive cough x 10 days. Reports she has taken mucinex with not much relief. Sinus drainage and yellow colored mucus, no fever    Subjective    HPI HPI     Cough    Additional comments: Patient presents with productive cough x 10 days. Reports she has taken mucinex with not much relief. Sinus drainage and yellow colored mucus, no fever       Last edited by Cherry Chiquita HERO, CMA on 09/30/2024 10:58 AM.       Discussed the use of AI scribe software for clinical note transcription with the patient, who gave verbal consent to proceed.  History of Present Illness Sandra Franco is a 73 year old female with central lobar emphysema who presents with a productive cough.  She has been experiencing a productive cough for the past ten days. Initially, the sputum was clear and thick, but it darkened to a yellow color around five days after the onset of symptoms. This morning, the sputum returned to being clear and thick. The cough is worse in the morning due to overnight accumulation. No fever or chills, although she had a temperature of 62F this morning, which is unusual for her. No throat or ear pain.  She has a history of central lobar emphysema. She continues to use Trelegy 100/62.5/25 mcg, one puff daily. She is allergic to amoxicillin, cefdinir, and codeine, which limits her antibiotic options.  She is managing hypertension and takes anastrozole  1 mg daily for a history of breast cancer. She monitors her blood pressure regularly, reporting readings such as 123/74 mmHg and 130/80 mmHg, which she considers good for a doctor's office visit. She also takes Claritin  10 mg daily for environmental allergies.  She mentions a recent cold contracted from her daughter, which she believes led to  her current symptoms. She initially tried Mucinex for relief, but it did not help. She is concerned about the cough lingering as it previously led to pneumonia.  She is following up on her use of buspirone , which she feels is helping her return to sleep more quickly after waking up at night. She previously used Wellbutrin , which she felt was not effective.     Medications: Outpatient Medications Prior to Visit  Medication Sig   busPIRone  (BUSPAR ) 5 MG tablet Take 1 tablet (5 mg total) by mouth 2 (two) times daily.   Calcium  Citrate-Vitamin D  (CALCIUM  + D PO) Take 1 capsule by mouth daily.   Fluticasone-Umeclidin-Vilant (TRELEGY ELLIPTA ) 100-62.5-25 MCG/ACT AEPB Inhale 1 puff into the lungs daily.   ibuprofen (ADVIL) 200 MG tablet Take 200 mg by mouth every 6 (six) hours as needed.   Multiple Vitamin (MULTIVITAMIN) capsule Take 1 capsule by mouth daily.   rosuvastatin  (CRESTOR ) 20 MG tablet Take 1 tablet (20 mg total) by mouth daily.   [DISCONTINUED] anastrozole  (ARIMIDEX ) 1 MG tablet TAKE 1 TABLET(1 MG) BY MOUTH DAILY   [DISCONTINUED] loratadine  (CLARITIN ) 10 MG tablet Take 1 tablet (10 mg total) by mouth daily.   [DISCONTINUED] buPROPion  (WELLBUTRIN  XL) 150 MG 24 hr tablet Take 1 tablet (150 mg total) by mouth 2 (two) times a week. (Patient not taking: Reported on 09/19/2024)   No facility-administered medications prior to visit.    Last  CBC Lab Results  Component Value Date   WBC 5.9 07/07/2024   HGB 13.8 07/07/2024   HCT 43.2 07/07/2024   MCV 95 07/07/2024   MCH 30.4 07/07/2024   RDW 14.2 07/07/2024   PLT CANCELED 07/07/2024   Last metabolic panel Lab Results  Component Value Date   GLUCOSE 85 01/01/2024   NA 137 01/01/2024   K 4.1 01/01/2024   CL 98 01/01/2024   CO2 25 01/01/2024   BUN 11 01/01/2024   CREATININE 0.81 01/01/2024   EGFR 76 01/01/2024   CALCIUM  10.0 01/01/2024   PROT 7.1 01/01/2024   ALBUMIN 4.6 01/01/2024   LABGLOB 2.5 01/01/2024   AGRATIO 1.8  12/26/2021   BILITOT 0.6 01/01/2024   ALKPHOS 91 01/01/2024   AST 32 01/01/2024   ALT 30 01/01/2024   ANIONGAP 8 07/31/2023   Last lipids Lab Results  Component Value Date   CHOL 160 01/01/2024   HDL 90 01/01/2024   LDLCALC 56 01/01/2024   TRIG 70 01/01/2024   CHOLHDL 1.8 01/01/2024   Last hemoglobin A1c Lab Results  Component Value Date   HGBA1C 5.1 01/01/2024   Last thyroid  functions Lab Results  Component Value Date   TSH 1.200 01/01/2024   FREET4 1.07 01/01/2024   Last vitamin D  Lab Results  Component Value Date   VD25OH 54.5 01/01/2024   Last vitamin B12 and Folate No results found for: VITAMINB12, FOLATE      Objective    BP 132/80 (BP Location: Left Arm, Patient Position: Sitting, Cuff Size: Normal)   Pulse 82   Temp 99 F (37.2 C) (Oral)   Ht 5' 5 (1.651 m)   Wt 113 lb 14.4 oz (51.7 kg)   SpO2 99%   BMI 18.95 kg/m  BP Readings from Last 3 Encounters:  09/30/24 132/80  09/19/24 (!) 143/75  08/14/24 (!) 150/70   Wt Readings from Last 3 Encounters:  09/30/24 113 lb 14.4 oz (51.7 kg)  09/19/24 112 lb 3.2 oz (50.9 kg)  08/14/24 114 lb 4.8 oz (51.8 kg)      Physical Exam   Physical Exam GENERAL: No respiratory distress, non-toxic appearance, speaks in complete sentences HEENT: no maxillary nor frontal sinus tenderness, no erythematous oropharynx, nares are patent, no rhinorrhea  CHEST: Clear to auscultation bilaterally, no wheezes, no crackles CARDIOVASCULAR: Regular rate and rhythm, S1 and S2 normal without murmurs   No results found for any visits on 09/30/24.  Assessment & Plan      Assessment & Plan Acute exacerbation of chronic obstructive pulmonary disease (COPD) with productive cough Acute exacerbation of COPD with productive cough persisting for ten days. Cough initially improved with Mucinex but worsened with darker yellow sputum, now clear and thick. No fever, chills, or respiratory distress. Negative COVID and influenza  tests. Decision to treat as an exacerbation due to history of emphysema and sputum changes. Avoiding combination antibiotics due to potential gastrointestinal side effects. - Prescribed doxycycline 100 mg twice daily for five days. - Prescribed prednisone 40 mg once daily for five days. - Advised to seek immediate care if experiencing lightheadedness, dizziness, difficulty breathing, or chest tightness.  Hypertension Chronic Blood pressure readings are generally well-controlled with occasional elevations, likely situational. Recent readings include 123/74, 130/80, and 125/77. - Continue current antihypertensive control with lifestyle management  -no new prescription recommendations today    Insomnia Chronic  Improvement in sleep latency with buspirone . Occasional difficulty returning to sleep, possibly due to anxiety. Wellbutrin  was ineffective. - Continue  buspirone  5mg  as it is helping with sleep.  Allergic rhinitis Managed with Claritin  10 mg daily. - Continue Claritin  10 mg daily.   Depression  Chronic  Improved  Continue buspar  10mg  daily Reports wellbutrin  was ineffective    Return in about 10 days (around 10/10/2024), or if symptoms worsen or fail to improve.        Rockie Agent, MD  [facility] [phone] (phone) [phone] (fax)  [facility]

## 2024-10-09 ENCOUNTER — Encounter: Payer: Self-pay | Admitting: Family Medicine

## 2024-10-10 ENCOUNTER — Other Ambulatory Visit: Payer: Self-pay | Admitting: Family Medicine

## 2024-10-10 DIAGNOSIS — I10 Essential (primary) hypertension: Secondary | ICD-10-CM

## 2024-10-10 DIAGNOSIS — R454 Irritability and anger: Secondary | ICD-10-CM

## 2024-10-10 MED ORDER — BUSPIRONE HCL 10 MG PO TABS
10.0000 mg | ORAL_TABLET | Freq: Two times a day (BID) | ORAL | 1 refills | Status: AC
Start: 2024-10-10 — End: ?

## 2024-10-10 NOTE — Progress Notes (Signed)
 Updated buspar  to 10mg  BID for irritability

## 2024-11-17 ENCOUNTER — Other Ambulatory Visit: Payer: Self-pay | Admitting: Oncology

## 2024-11-17 DIAGNOSIS — D0511 Intraductal carcinoma in situ of right breast: Secondary | ICD-10-CM

## 2024-12-11 ENCOUNTER — Other Ambulatory Visit: Payer: Self-pay | Admitting: Family Medicine

## 2024-12-11 DIAGNOSIS — J432 Centrilobular emphysema: Secondary | ICD-10-CM

## 2025-01-01 ENCOUNTER — Ambulatory Visit: Payer: Medicare PPO | Admitting: Family Medicine

## 2025-01-01 ENCOUNTER — Encounter: Payer: Self-pay | Admitting: Family Medicine

## 2025-01-01 VITALS — BP 130/62 | HR 69 | Temp 97.9°F | Ht 65.0 in | Wt 110.3 lb

## 2025-01-01 DIAGNOSIS — D696 Thrombocytopenia, unspecified: Secondary | ICD-10-CM

## 2025-01-01 DIAGNOSIS — J432 Centrilobular emphysema: Secondary | ICD-10-CM

## 2025-01-01 DIAGNOSIS — Z131 Encounter for screening for diabetes mellitus: Secondary | ICD-10-CM

## 2025-01-01 DIAGNOSIS — M8589 Other specified disorders of bone density and structure, multiple sites: Secondary | ICD-10-CM

## 2025-01-01 DIAGNOSIS — I700 Atherosclerosis of aorta: Secondary | ICD-10-CM

## 2025-01-01 DIAGNOSIS — I10 Essential (primary) hypertension: Secondary | ICD-10-CM

## 2025-01-01 DIAGNOSIS — F1090 Alcohol use, unspecified, uncomplicated: Secondary | ICD-10-CM

## 2025-01-01 DIAGNOSIS — Z1329 Encounter for screening for other suspected endocrine disorder: Secondary | ICD-10-CM

## 2025-01-01 DIAGNOSIS — Z0000 Encounter for general adult medical examination without abnormal findings: Secondary | ICD-10-CM

## 2025-01-01 DIAGNOSIS — F17200 Nicotine dependence, unspecified, uncomplicated: Secondary | ICD-10-CM

## 2025-01-01 NOTE — Progress Notes (Signed)
 "  Complete physical exam  Patient: Sandra Franco    DOB: 12-09-1950 74 y.o.   MRN: [id number]  Chief Complaint  Patient presents with   Annual Exam    Patient is present for annual exam with PCP. Diet is normal well balanced per patient. No exercise regimen currently, does things around the house    Subjective:    Sandra Franco is a 74 y.o. female who presents today for a complete physical exam.      She does not have additional problems to discuss today.   Discussed the use of AI scribe software for clinical note transcription with the patient, who gave verbal consent to proceed.  History of Present Illness Sandra Franco is a 74 year old female who presents for an annual physical exam.  She has a history of platelet clumping, which has previously required a manual count at the hospital. She is willing to have labs drawn today if this issue is considered.  She uses Trelegy for respiratory issues and generally experiences good breathing. Shortness of breath occurs only during physical exertion, such as shoveling snow or when her granddaughter visits, but not under ordinary circumstances.  She states her blood pressure is usually normal at home, typically around 130/70 or 125/70. She attributes any fluctuations to stress, particularly due to her husband's current health issues.  She smokes about half a pack of cigarettes a day and does not express a desire to quit, viewing smoking as her 'private time' to relax. She has attempted to quit in the past but feels unable to do so.  She consumes about three beers a day. Her husband is undergoing radiation treatment for lung and spine issues, which has been a source of stress for her.    Most recent fall risk assessment:    01/01/2025   10:27 AM  Fall Risk   Falls in the past year? 0  Number falls in past yr: 0  Injury with Fall? 0  Risk for fall due to : No Fall Risks  Follow up Falls evaluation completed     Most recent  depression screenings:    01/01/2025   10:28 AM 09/30/2024   11:00 AM  PHQ 2/9 Scores  PHQ - 2 Score 2 2  PHQ- 9 Score 6 3      Data saved with a previous flowsheet row definition      Patient Care Team: Sharma Coyer, MD as PCP - General (Family Medicine) Georgina Shasta POUR, RN as Oncology Nurse Navigator Melanee Annah BROCKS, MD as Consulting Physician (Oncology) Royetta Age, OD as Referring Physician (Optometry)   ROS    Objective:    BP 130/62 (Cuff Size: Normal)   Pulse 69   Temp 97.9 F (36.6 C) (Oral)   Ht 5' 5 (1.651 m)   Wt 110 lb 4.8 oz (50 kg)   SpO2 100%   BMI 18.35 kg/m  BP Readings from Last 3 Encounters:  01/01/25 130/62  09/30/24 132/80  09/19/24 (!) 143/75   Wt Readings from Last 3 Encounters:  01/01/25 110 lb 4.8 oz (50 kg)  09/30/24 113 lb 14.4 oz (51.7 kg)  09/19/24 112 lb 3.2 oz (50.9 kg)      Physical Exam Vitals reviewed.  Constitutional:      General: She is not in acute distress.    Appearance: Normal appearance. She is not ill-appearing, toxic-appearing or diaphoretic.  HENT:     Head: Normocephalic and atraumatic.     Right Ear:  Tympanic membrane and external ear normal. There is no impacted cerumen.     Left Ear: Tympanic membrane and external ear normal. There is no impacted cerumen.     Nose: Nose normal.     Mouth/Throat:     Pharynx: Oropharynx is clear.  Eyes:     General: No scleral icterus.    Extraocular Movements: Extraocular movements intact.     Conjunctiva/sclera: Conjunctivae normal.     Pupils: Pupils are equal, round, and reactive to light.  Cardiovascular:     Rate and Rhythm: Normal rate and regular rhythm.     Pulses: Normal pulses.     Heart sounds: Normal heart sounds. No murmur heard.    No friction rub. No gallop.  Pulmonary:     Effort: Pulmonary effort is normal. No respiratory distress.     Breath sounds: Normal breath sounds. No wheezing, rhonchi or rales.  Abdominal:     General: Bowel sounds  are normal. There is no distension.     Palpations: Abdomen is soft. There is no mass.     Tenderness: There is no abdominal tenderness. There is no guarding.  Musculoskeletal:        General: No deformity.     Cervical back: Normal range of motion and neck supple.     Right lower leg: No edema.     Left lower leg: No edema.  Lymphadenopathy:     Cervical: No cervical adenopathy.  Skin:    General: Skin is warm.     Capillary Refill: Capillary refill takes less than 2 seconds.     Findings: No erythema or rash.  Neurological:     General: No focal deficit present.     Mental Status: She is alert and oriented to person, place, and time.     Cranial Nerves: Cranial nerves 2-12 are intact. No cranial nerve deficit or facial asymmetry.     Motor: Motor function is intact. No weakness.     Gait: Gait normal.  Psychiatric:        Mood and Affect: Mood normal.        Behavior: Behavior normal.       No results found for any visits on 01/01/25. Last CBC Lab Results  Component Value Date   WBC 5.9 07/07/2024   HGB 13.8 07/07/2024   HCT 43.2 07/07/2024   MCV 95 07/07/2024   MCH 30.4 07/07/2024   RDW 14.2 07/07/2024   PLT CANCELED 07/07/2024   Last metabolic panel Lab Results  Component Value Date   GLUCOSE 85 01/01/2024   NA 137 01/01/2024   K 4.1 01/01/2024   CL 98 01/01/2024   CO2 25 01/01/2024   BUN 11 01/01/2024   CREATININE 0.81 01/01/2024   EGFR 76 01/01/2024   CALCIUM  10.0 01/01/2024   PROT 7.1 01/01/2024   ALBUMIN 4.6 01/01/2024   LABGLOB 2.5 01/01/2024   AGRATIO 1.8 12/26/2021   BILITOT 0.6 01/01/2024   ALKPHOS 91 01/01/2024   AST 32 01/01/2024   ALT 30 01/01/2024   ANIONGAP 8 07/31/2023   Last lipids Lab Results  Component Value Date   CHOL 160 01/01/2024   HDL 90 01/01/2024   LDLCALC 56 01/01/2024   TRIG 70 01/01/2024   CHOLHDL 1.8 01/01/2024   Last hemoglobin A1c Lab Results  Component Value Date   HGBA1C 5.1 01/01/2024   Last thyroid   functions Lab Results  Component Value Date   TSH 1.200 01/01/2024   FREET4 1.07 01/01/2024  Last vitamin D  Lab Results  Component Value Date   VD25OH 54.5 01/01/2024   Last vitamin B12 and Folate No results found for: VITAMINB12, FOLATE       Assessment & Plan:    Routine Health Maintenance and Physical Exam Immunization History  Administered Date(s) Administered   Fluad Quad(high Dose 65+) 08/21/2019, 08/24/2022   INFLUENZA, HIGH DOSE SEASONAL PF 09/08/2018, 08/27/2020, 08/20/2023, 08/14/2024   Influenza-Unspecified 08/24/2017, 09/08/2018, 08/21/2019, 09/06/2021, 08/24/2022, 08/10/2023   PFIZER(Purple Top)SARS-COV-2 Vaccination 01/19/2020, 02/09/2020, 09/06/2020   PNEUMOCOCCAL CONJUGATE-20 01/01/2024   Pneumococcal Polysaccharide-23 09/12/2013   Tdap 01/27/2011, 01/09/2024, 01/10/2024    Health Maintenance  Topic Date Due   Zoster Vaccines- Shingrix (1 of 2) Never done   Medicare Annual Wellness (AWV)  12/31/2024   Mammogram  02/14/2025   COVID-19 Vaccine (4 - 2025-26 season) 01/17/2025 (Originally 07/28/2024)   Lung Cancer Screening  05/14/2025   Bone Density Scan  09/12/2025   Colonoscopy  09/19/2029   DTaP/Tdap/Td (4 - Td or Tdap) 01/09/2034   Pneumococcal Vaccine: 50+ Years  Completed   Influenza Vaccine  Completed   Hepatitis C Screening  Completed   Meningococcal B Vaccine  Aged Out    Discussed health benefits of physical activity, and encouraged her to engage in regular exercise appropriate for her age and condition.  Problem List Items Addressed This Visit     Atherosclerosis of aorta   Centrilobular emphysema (HCC)   Heavy alcohol consumption   HTN (hypertension)   Chronic  Blood pressure initially elevated at 144/84 mmHg, likely due to white coat syndrome. Rechecked at 130/62 mmHg. No current antihypertensive medication. Stress and lifestyle factors may contribute to elevated readings. - Continue to monitor blood pressure at home. - Will  consider low-dose antihypertensive medication if home readings remain elevated. -ordered CMP       Relevant Orders   CMP14+EGFR   Osteopenia   Thrombocytopenia   Relevant Orders   CBC   Tobacco use disorder   Advised smoking cessation  Counseled on smoking cessation for 5 mins  Pt declines medication assisted cessation       Other Visit Diagnoses       Annual physical exam    -  Primary     Screening for diabetes mellitus       Relevant Orders   Hemoglobin A1c     Screening for thyroid  disorder       Relevant Orders   TSH + free T4       Assessment and Plan Assessment & Plan    Centrilobular emphysema Chronic  Well-managed respiratory status on Trelegy. Occasional dyspnea with exertion, such as shoveling or increased activity with granddaughter. No wheezing or crackles on examination. - Continue Trelegy for respiratory management.    Heavy alcohol consumption Chronic  Consumes approximately three beers per day, with occasional abstinence when unwell. Acknowledges stress-related alcohol use. Advised to limit alcohol intake to no more than seven drinks per week to reduce cardiovascular risk. - Advised to limit alcohol intake to no more than seven drinks per week.  Thrombocytopenia Chronic  Previous episodes of clumped platelets requiring manual count. - Ordered CBC to check platelet count.  General Health Maintenance Tetanus booster updated in February 2025. -pt declines recommendation for AWV  - Discussed recommendation for shingles vaccine with pharmacy. A1c for diabetes screening ordered  CMP ordered  TSH and T4 ordered     Return in about 6 months (around 07/01/2025), or if symptoms worsen or fail to  improve, for Chronic F/U, HTN, Emphysema .    Rockie Agent, MD [facility]   "

## 2025-01-01 NOTE — Assessment & Plan Note (Signed)
 Advised smoking cessation  Counseled on smoking cessation for 5 mins  Pt declines medication assisted cessation

## 2025-01-01 NOTE — Patient Instructions (Addendum)
 To keep you healthy, please keep in mind the following health maintenance items that you are due for:   Health Maintenance Due  Topic Date Due   Zoster Vaccines- Shingrix (1 of 2) Never done   Medicare Annual Wellness (AWV)  12/31/2024   Mammogram  02/14/2025     Best Wishes,   Dr. Lang

## 2025-01-01 NOTE — Assessment & Plan Note (Addendum)
 Chronic  Blood pressure initially elevated at 144/84 mmHg, likely due to white coat syndrome. Rechecked at 130/62 mmHg. No current antihypertensive medication. Stress and lifestyle factors may contribute to elevated readings. - Continue to monitor blood pressure at home. - Will consider low-dose antihypertensive medication if home readings remain elevated. -ordered CMP

## 2025-01-02 LAB — CBC
Hematocrit: 43.4 % (ref 34.0–46.6)
Hemoglobin: 14.5 g/dL (ref 11.1–15.9)
MCH: 31.1 pg (ref 26.6–33.0)
MCHC: 33.4 g/dL (ref 31.5–35.7)
MCV: 93 fL (ref 79–97)
Platelets: 112 10*3/uL — ABNORMAL LOW (ref 150–450)
RBC: 4.66 x10E6/uL (ref 3.77–5.28)
RDW: 13.4 % (ref 11.7–15.4)
WBC: 5.6 10*3/uL (ref 3.4–10.8)

## 2025-01-02 LAB — TSH+FREE T4
Free T4: 1.24 ng/dL (ref 0.82–1.77)
TSH: 0.955 u[IU]/mL (ref 0.450–4.500)

## 2025-01-02 LAB — CMP14+EGFR
ALT: 40 [IU]/L — ABNORMAL HIGH (ref 0–32)
AST: 43 [IU]/L — ABNORMAL HIGH (ref 0–40)
Albumin: 4.7 g/dL (ref 3.8–4.8)
Alkaline Phosphatase: 94 [IU]/L (ref 49–135)
BUN/Creatinine Ratio: 14 (ref 12–28)
BUN: 10 mg/dL (ref 8–27)
Bilirubin Total: 0.7 mg/dL (ref 0.0–1.2)
CO2: 25 mmol/L (ref 20–29)
Calcium: 11.2 mg/dL — ABNORMAL HIGH (ref 8.7–10.3)
Chloride: 101 mmol/L (ref 96–106)
Creatinine, Ser: 0.69 mg/dL (ref 0.57–1.00)
Globulin, Total: 2.2 g/dL (ref 1.5–4.5)
Glucose: 88 mg/dL (ref 70–99)
Potassium: 4.1 mmol/L (ref 3.5–5.2)
Sodium: 142 mmol/L (ref 134–144)
Total Protein: 6.9 g/dL (ref 6.0–8.5)
eGFR: 90 mL/min/{1.73_m2}

## 2025-01-02 LAB — HEMOGLOBIN A1C
Est. average glucose Bld gHb Est-mCnc: 97 mg/dL
Hgb A1c MFr Bld: 5 % (ref 4.8–5.6)

## 2025-02-16 ENCOUNTER — Ambulatory Visit: Admitting: Radiation Oncology

## 2025-02-19 ENCOUNTER — Encounter

## 2025-04-29 ENCOUNTER — Ambulatory Visit: Admitting: Oncology
